# Patient Record
Sex: Male | Born: 1970 | Race: White | Hispanic: No | Marital: Single | Smoking: Never smoker
Health system: Southern US, Community
[De-identification: ages and names within clinical notes are randomized; demographics above are authoritative.]

## PROBLEM LIST (undated history)

## (undated) DIAGNOSIS — M1990 Unspecified osteoarthritis, unspecified site: Secondary | ICD-10-CM

## (undated) DIAGNOSIS — I10 Essential (primary) hypertension: Secondary | ICD-10-CM

## (undated) DIAGNOSIS — I671 Cerebral aneurysm, nonruptured: Secondary | ICD-10-CM

## (undated) HISTORY — PX: HERNIA REPAIR: SHX51

## (undated) HISTORY — PX: WISDOM TOOTH EXTRACTION: SHX21

---

## 2018-09-11 ENCOUNTER — Emergency Department (HOSPITAL_BASED_OUTPATIENT_CLINIC_OR_DEPARTMENT_OTHER)
Admission: EM | Admit: 2018-09-11 | Discharge: 2018-09-11 | Disposition: A | Discharge status: Home / Self Care | Payer: PRIVATE HEALTH INSURANCE | Attending: Emergency Medicine | Admitting: Emergency Medicine

## 2018-09-11 ENCOUNTER — Other Ambulatory Visit: Payer: Self-pay

## 2018-09-11 ENCOUNTER — Encounter (HOSPITAL_BASED_OUTPATIENT_CLINIC_OR_DEPARTMENT_OTHER): Payer: Self-pay | Admitting: Emergency Medicine

## 2018-09-11 DIAGNOSIS — M545 Low back pain, unspecified: Secondary | ICD-10-CM

## 2018-09-11 DIAGNOSIS — I10 Essential (primary) hypertension: Secondary | ICD-10-CM | POA: Insufficient documentation

## 2018-09-11 DIAGNOSIS — Z79899 Other long term (current) drug therapy: Secondary | ICD-10-CM | POA: Diagnosis not present

## 2018-09-11 HISTORY — DX: Essential (primary) hypertension: I10

## 2018-09-11 MED ORDER — KETOROLAC TROMETHAMINE 15 MG/ML IJ SOLN
15.0000 mg | Freq: Once | INTRAMUSCULAR | Status: AC
  Administered 2018-09-11: 15 mg via INTRAMUSCULAR
  Filled 2018-09-11: qty 1

## 2018-09-11 MED ORDER — METHOCARBAMOL 500 MG PO TABS: 500.0000 mg | ORAL_TABLET | Freq: Every evening | ORAL | 0 refills | Status: DC | PRN

## 2018-09-11 MED ORDER — PREDNISONE 10 MG (21) PO TBPK: ORAL_TABLET | Freq: Every day | ORAL | 0 refills | Status: DC

## 2018-09-11 NOTE — ED Notes (Signed)
Pt/family verbalized understanding of discharge instructions.   

## 2018-09-11 NOTE — Discharge Instructions (Addendum)
Take prednisone as prescribed. Avoid other anti-inflammatories at the same time (Advil, Motrin, ibuprofen, naproxen, Aleve). You may supplement with Tylenol if you need further pain control. Use Robaxin as needed for muscle stiffness or soreness. Have caution, as this may make you tired or groggy. Do not drive or operate heavy machinery while taking this medication.  Use muscle creams (bengay, icy hot, salonpas) as needed for pain.  Use heat or ice for pain control. You may try back stretches for pain. Follow up with orthopedics for further evaluation.  Return to the ER if you develop high fevers, numbness, loss of bowel or bladder control, or any new or concerning symptoms.

## 2018-09-11 NOTE — ED Provider Notes (Signed)
[facility] EMERGENCY DEPARTMENT Provider Note   CSN: [id number] Arrival date & time: 09/11/18  1230     History   Chief Complaint Chief Complaint  Patient presents with  . Back Pain    HPI Derrick Wall is a 47 y.o. male center for evaluation of back pain.  Patient states over the past 6 to 7 months, he has been having persistent low back pain.  Is bilateral, currently worse in the left side.  Symptoms are worse when he is walking, improves when he is sitting.  He has been evaluated by his PCP multiple times for this, been found to have degenerative disease, bone spurs, and a pinched nerve.  He was referred to spine clinic, but states he cannot afford to follow-up with spine.  He denies new fall, trauma, or injury.  He denies fevers, chills, rash, numbness, loss of bowel bladder control, history of cancer, history of IV drug use.  He has been taking Aleve without significant improvement of his symptoms.  He has not been taking anything else for pain.  He saw a chiropractor, had a realignment which mildly improved his pain.  This is about a month and a half ago.  He is here hoping to get an MRI for further evaluation.  Patient describes pain as a "catching" pain.  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  HPI  Past Medical History:  Diagnosis Date  . Hypertension     There are no active problems to display for this patient.   Past Surgical History:  Procedure Laterality Date  . HERNIA REPAIR          Home Medications    Prior to Admission medications   Medication Sig Start Date End Date Taking? Authorizing Provider  amLODipine (NORVASC) 10 MG tablet Take 10 mg by mouth daily.   Yes [provider]  losartan (COZAAR) 25 MG tablet Take 25 mg by mouth daily.   Yes [provider]  tamsulosin (FLOMAX) 0.4 MG CAPS capsule Take 0.4 mg by mouth.   Yes [provider]  methocarbamol (ROBAXIN) 500 MG tablet Take 1 tablet  (500 mg total) by mouth at bedtime as needed for muscle spasms. 09/11/18   Dejae Bernet, PA-C  predniSONE (STERAPRED UNI-PAK 21 TAB) 10 MG (21) TBPK tablet Take by mouth daily. Take 6 tabs by mouth daily  for 2 days, then 5 tabs for 2 days, then 4 tabs for 2 days, then 3 tabs for 2 days, 2 tabs for 2 days, then 1 tab by mouth daily for 2 days 09/11/18   Zian Delair, PA-C    Family History No family history on file.  Social History Social History   Tobacco Use  . Smoking status: Never Smoker  . Smokeless tobacco: Never Used  Substance Use Topics  . Alcohol use: Yes    Comment: daily  . Drug use: Yes    Types: Marijuana     Allergies   Patient has no known allergies.   Review of Systems Review of Systems  Constitutional: Negative for fever.  Genitourinary: Negative for frequency and hematuria.  Musculoskeletal: Positive for back pain. Negative for neck pain.     Physical Exam Updated Vital Signs BP (!) 146/113 (BP Location: Left Arm)   Pulse 87   Temp 98.7 F (37.1 C) (Oral)   Resp 18   Ht 5\' 5"  (1.651 m)   Wt 102.5 kg   SpO2 99%   BMI  37.61 kg/m   Physical Exam  Constitutional: He is oriented to person, place, and time. He appears well-developed and well-nourished. No distress.  Sitting in the bed in no acute distress  HENT:  Head: Normocephalic and atraumatic.  Eyes: Pupils are equal, round, and reactive to light. Conjunctivae and EOM are normal.  Neck: Normal range of motion. Neck supple.  Cardiovascular: Normal rate, regular rhythm and intact distal pulses.  Pulmonary/Chest: Effort normal and breath sounds normal. No respiratory distress. He has no wheezes.  Abdominal: Soft. He exhibits no distension. There is no tenderness.  Musculoskeletal: Normal range of motion. He exhibits tenderness.  Tenderness palpation low back and bilateral low back musculature.  No step-offs or deformities.  Strength of lower extremities intact bilaterally.  Station  intact bilaterally.  Pedal pulses intact bilaterally.  Soft compartments.  Patient is ambulatory. No saddle paresthesias.  Neurological: He is alert and oriented to person, place, and time. No sensory deficit.  Skin: Skin is warm and dry. Capillary refill takes less than 2 seconds.  Psychiatric: He has a normal mood and affect.  Nursing note and vitals reviewed.    ED Treatments / Results  Labs (all labs ordered are listed, but only abnormal results are displayed) Labs Reviewed - No data to display  EKG None  Radiology No results found.  Procedures Procedures (including critical care time)  Medications Ordered in ED Medications  ketorolac (TORADOL) 15 MG/ML injection 15 mg (15 mg Intramuscular Given 09/11/18 1318)     Initial Impression / Assessment and Plan / ED Course  I have reviewed the triage vital signs and the nursing notes.  Pertinent labs & imaging results that were available during my care of the patient were reviewed by me and considered in my medical decision making (see chart for details).     Patient presenting for evaluation of low back pain.  Physical exam reassuring, neurovascularly intact.  No red flags for back pain.  Pain is reproducible with palpation. Pt had imaging with his PCP, told that he has a pinched nerve and degenerative disease.  No new injury, I do not believe further imaging is necessary today. Doubt new vertebral injury, infection, spinal cord compression, myelopathy, or cauda equina syndrome.  Will treat symptomatically with prednisone course, muscle relaxers, muscle creams.  Patient given information to follow-up with orthopedics.  At this time, patient appears safe for discharge.  Return precautions given.  Patient states he understands agrees plan.  Final Clinical Impressions(s) / ED Diagnoses   Final diagnoses:  Acute bilateral low back pain without sciatica    ED Discharge Orders         Ordered    predniSONE (STERAPRED UNI-PAK 21  TAB) 10 MG (21) TBPK tablet  Daily     09/11/18 1315    methocarbamol (ROBAXIN) 500 MG tablet  At bedtime PRN     09/11/18 1315           Johnette Teigen, PA-C 09/11/18 1425    Pricilla LovelessGoldston, Scott, MD 09/11/18 1426

## 2018-09-11 NOTE — ED Triage Notes (Addendum)
Low back pain x 6 months, worsening over the last week. States he has had to use a cane to ambulate.

## 2018-09-27 ENCOUNTER — Other Ambulatory Visit: Payer: Self-pay | Admitting: Neurosurgery

## 2018-09-27 ENCOUNTER — Other Ambulatory Visit (HOSPITAL_COMMUNITY): Payer: Self-pay | Admitting: Neurosurgery

## 2018-09-27 DIAGNOSIS — M4714 Other spondylosis with myelopathy, thoracic region: Secondary | ICD-10-CM

## 2018-09-30 NOTE — H&P (Addendum)
Chief Complaint   Back pain  HPI   HPI: Derrick Wall is a 47 y.o. male with 7 month history of low back pain and worsening gait instability.   Pain is localized to the midline of his thoracolumbar junction.  Pain is worse with flexion.  Pain does not radiate.  Pain has been progressively worsening and in fact over the past 2 weeks he has had to use a wheelchair due to leg weakness and numbness.  He has also noted urinary retention. 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  He presents today for  diagnostic angiogram. He is without any concerns.   There are no active problems to display for this patient.   PMH: Past Medical History:  Diagnosis Date  . Hypertension     PSH: Past Surgical History:  Procedure Laterality Date  . HERNIA REPAIR       (Not in a hospital admission)  SH: Social History   Tobacco Use  . Smoking status: Never Smoker  . Smokeless tobacco: Never Used  Substance Use Topics  . Alcohol use: Yes    Comment: daily  . Drug use: Yes    Types: Marijuana    MEDS: Prior to Admission medications   Medication Sig Start Date End Date Taking? Authorizing Provider  amLODipine (NORVASC) 10 MG tablet Take 10 mg by mouth daily.    [provider]  losartan (COZAAR) 25 MG tablet Take 25 mg by mouth daily.    [provider]  methocarbamol (ROBAXIN) 500 MG tablet Take 1 tablet (500 mg total) by mouth at bedtime as needed for muscle spasms. 09/11/18   Caccavale, Sophia, PA-C  predniSONE (STERAPRED UNI-PAK 21 TAB) 10 MG (21) TBPK tablet Take by mouth daily. Take 6 tabs by mouth daily  for 2 days, then 5 tabs for 2 days, then 4 tabs for 2 days, then 3 tabs for 2 days, 2 tabs for 2 days, then 1 tab by mouth daily for 2 days 09/11/18   Caccavale, Sophia, PA-C  tamsulosin (FLOMAX) 0.4 MG CAPS capsule Take 0.4 mg by mouth.    [provider]    ALLERGY: No Known  Allergies  Social History   Tobacco Use  . Smoking status: Never Smoker  . Smokeless tobacco: Never Used  Substance Use Topics  . Alcohol use: Yes    Comment: daily     No family history on file.   ROS   ROS  Exam   There were no vitals filed for this visit. General appearance: WDWN, NAD Eyes: No scleral injection Cardiovascular: Regular rate and rhythm without murmurs, rubs, gallops. No edema or variciosities. Distal pulses normal. Pulmonary: Effort normal, non-labored breathing Musculoskeletal:     Muscle tone upper extremities: Normal    Muscle tone lower extremities: Normal    Motor exam:  Right Left Deltoid: normal normal Biceps: normal normal Triceps: normal normal Infraspinatus: normal normal Wrist Extensor: normal normal Grip: normal normal Hip Flexor: 4-/5 4-/5 Knee Extensor: 4-/5 4-/5 Tib Anterior: 4-/5 4-/5 EHL: 4-/5 4-/5 Medial Gastroc: 4-/5 4-/5   Neurological Mental Status:    - Patient is awake, alert, oriented to person, place, month, year, and situation    - Patient is able to give a clear and coherent history.    - No signs of aphasia or neglect Cranial Nerves    - II: Visual Fields are full. Pupils are equal, round, and reactive  to light.     - III/IV/VI: EOMI without ptosis or diploplia.     - V: Facial sensation is grossly normal    - VII: Facial movement is symmetric.     - VIII: hearing is intact to voice    - X: Uvula elevates symmetrically    - XI: Shoulder shrug is symmetric.    - XII: tongue is midline without atrophy or fasciculations.  Sensory:   T12 sensory level diffuse numbness distally. Deep Tendon Reflexes    -   Normal in biceps.  Decreased and patellar bilaterally. Plantars   - Toes are downgoing bilaterally.   Results - Imaging/Labs   No results found for this or any previous visit (from the past 48 hour(s)).  No results found.  IMAGING: MRI of the thoracic and lumbar spine personally reviewed, there is  diffuse T2 hyperintensity throughout the thoracic cord from T9 down to the conus without any clear evidence of enhancing lesion.  On the sagittal T2 of the lumbar spine, at the area of T10-T12, there is some dorsal T2 hypointensity that could be due to flow voids.  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  We will proceed with spinal angiogram to confirm that diagnosis  In further characterize the fistula.    Risk, benefits and alternatives procedure were discussed.  Patient states in own language understanding wishes to proceed.

## 2018-10-04 ENCOUNTER — Other Ambulatory Visit (HOSPITAL_COMMUNITY): Payer: Self-pay | Admitting: Neurosurgery

## 2018-10-04 ENCOUNTER — Other Ambulatory Visit: Payer: Self-pay

## 2018-10-04 ENCOUNTER — Ambulatory Visit (HOSPITAL_COMMUNITY)
Admission: RE | Admit: 2018-10-04 | Discharge: 2018-10-04 | Disposition: A | Discharge status: Home / Self Care | Payer: PRIVATE HEALTH INSURANCE | Source: Ambulatory Visit | Attending: Neurosurgery | Admitting: Neurosurgery

## 2018-10-04 ENCOUNTER — Encounter (HOSPITAL_COMMUNITY): Payer: Self-pay | Admitting: Neurosurgery

## 2018-10-04 DIAGNOSIS — G9519 Other vascular myelopathies: Secondary | ICD-10-CM | POA: Insufficient documentation

## 2018-10-04 DIAGNOSIS — R339 Retention of urine, unspecified: Secondary | ICD-10-CM | POA: Diagnosis not present

## 2018-10-04 DIAGNOSIS — M545 Low back pain: Secondary | ICD-10-CM | POA: Insufficient documentation

## 2018-10-04 DIAGNOSIS — I10 Essential (primary) hypertension: Secondary | ICD-10-CM | POA: Insufficient documentation

## 2018-10-04 DIAGNOSIS — M5104 Intervertebral disc disorders with myelopathy, thoracic region: Secondary | ICD-10-CM | POA: Diagnosis not present

## 2018-10-04 DIAGNOSIS — R200 Anesthesia of skin: Secondary | ICD-10-CM | POA: Insufficient documentation

## 2018-10-04 DIAGNOSIS — R531 Weakness: Secondary | ICD-10-CM | POA: Diagnosis not present

## 2018-10-04 DIAGNOSIS — M4714 Other spondylosis with myelopathy, thoracic region: Secondary | ICD-10-CM

## 2018-10-04 HISTORY — PX: IR ANGIO/SPINAL RIGHT: IMG2271

## 2018-10-04 HISTORY — PX: IR ANGIO/SPINAL LEFT: IMG2270

## 2018-10-04 HISTORY — PX: IR ANGIO VERTEBRAL SEL VERTEBRAL UNI L MOD SED: IMG5367

## 2018-10-04 HISTORY — PX: IR ANGIO INTRA EXTRACRAN SEL INTERNAL CAROTID BILAT MOD SED: IMG5363

## 2018-10-04 LAB — CBC WITH DIFFERENTIAL/PLATELET
Abs Immature Granulocytes: 0.02 10*3/uL (ref 0.00–0.07)
Basophils Absolute: 0 10*3/uL (ref 0.0–0.1)
Basophils Relative: 0 %
EOS PCT: 2 %
Eosinophils Absolute: 0.1 10*3/uL (ref 0.0–0.5)
HCT: 46.7 % (ref 39.0–52.0)
HEMOGLOBIN: 15.7 g/dL (ref 13.0–17.0)
Immature Granulocytes: 0 %
LYMPHS PCT: 28 %
Lymphs Abs: 1.8 10*3/uL (ref 0.7–4.0)
MCH: 31.7 pg (ref 26.0–34.0)
MCHC: 33.6 g/dL (ref 30.0–36.0)
MCV: 94.3 fL (ref 80.0–100.0)
MONO ABS: 0.6 10*3/uL (ref 0.1–1.0)
Monocytes Relative: 9 %
Neutro Abs: 4 10*3/uL (ref 1.7–7.7)
Neutrophils Relative %: 61 %
Platelets: 199 10*3/uL (ref 150–400)
RBC: 4.95 MIL/uL (ref 4.22–5.81)
RDW: 12.3 % (ref 11.5–15.5)
WBC: 6.5 10*3/uL (ref 4.0–10.5)
nRBC: 0 % (ref 0.0–0.2)

## 2018-10-04 LAB — PROTIME-INR
INR: 0.93
Prothrombin Time: 12.4 seconds (ref 11.4–15.2)

## 2018-10-04 LAB — APTT: aPTT: 29 seconds (ref 24–36)

## 2018-10-04 MED ORDER — CHLORHEXIDINE GLUCONATE CLOTH 2 % EX PADS: 6.0000 | MEDICATED_PAD | Freq: Once | CUTANEOUS | Status: DC

## 2018-10-04 MED ORDER — MIDAZOLAM HCL 2 MG/2ML IJ SOLN
INTRAMUSCULAR | Status: AC
  Filled 2018-10-04: qty 2

## 2018-10-04 MED ORDER — FENTANYL CITRATE (PF) 100 MCG/2ML IJ SOLN
INTRAMUSCULAR | Status: AC
  Filled 2018-10-04: qty 2

## 2018-10-04 MED ORDER — MIDAZOLAM HCL 2 MG/2ML IJ SOLN
INTRAMUSCULAR | Status: AC | PRN
  Administered 2018-10-04: 1 mg via INTRAVENOUS

## 2018-10-04 MED ORDER — HEPARIN SODIUM (PORCINE) 1000 UNIT/ML IJ SOLN
INTRAMUSCULAR | Status: AC
  Filled 2018-10-04: qty 1

## 2018-10-04 MED ORDER — IOPAMIDOL (ISOVUE-300) INJECTION 61%
INTRAVENOUS | Status: AC
  Filled 2018-10-04: qty 50

## 2018-10-04 MED ORDER — HEPARIN SODIUM (PORCINE) 1000 UNIT/ML IJ SOLN
INTRAMUSCULAR | Status: AC | PRN
  Administered 2018-10-04: 2000 [IU] via INTRAVENOUS

## 2018-10-04 MED ORDER — CEFAZOLIN SODIUM-DEXTROSE 2-4 GM/100ML-% IV SOLN: 2.0000 g | INTRAVENOUS | Status: DC

## 2018-10-04 MED ORDER — IOHEXOL 300 MG/ML  SOLN: 99.0000 mL | Freq: Once | INTRAMUSCULAR | Status: DC | PRN

## 2018-10-04 MED ORDER — HYDROCODONE-ACETAMINOPHEN 5-325 MG PO TABS: 1.0000 | ORAL_TABLET | ORAL | Status: DC | PRN

## 2018-10-04 MED ORDER — LIDOCAINE HCL 1 % IJ SOLN
INTRAMUSCULAR | Status: AC
  Filled 2018-10-04: qty 20

## 2018-10-04 MED ORDER — FENTANYL CITRATE (PF) 100 MCG/2ML IJ SOLN
INTRAMUSCULAR | Status: AC | PRN
  Administered 2018-10-04: 25 ug via INTRAVENOUS

## 2018-10-04 MED ORDER — LIDOCAINE HCL (PF) 1 % IJ SOLN
INTRAMUSCULAR | Status: AC | PRN
  Administered 2018-10-04: 10 mL

## 2018-10-04 MED ORDER — SODIUM CHLORIDE 0.9 % IV SOLN: INTRAVENOUS | Status: DC

## 2018-10-04 NOTE — Progress Notes (Signed)
Patient and RN ambulated to bathroom. No bleeding or hematoma noted

## 2018-10-04 NOTE — Sedation Documentation (Signed)
Right groin sheath removed. 5Fr exoseal closure device used. Manual pressure being held at right groin site.

## 2018-10-04 NOTE — Discharge Instructions (Signed)

## 2018-10-06 ENCOUNTER — Encounter (HOSPITAL_COMMUNITY): Payer: Self-pay | Admitting: Neurosurgery

## 2018-10-12 ENCOUNTER — Other Ambulatory Visit: Payer: Self-pay | Admitting: Neurological Surgery

## 2018-10-15 ENCOUNTER — Other Ambulatory Visit: Payer: Self-pay

## 2018-10-15 ENCOUNTER — Encounter (HOSPITAL_COMMUNITY): Payer: Self-pay

## 2018-10-15 ENCOUNTER — Encounter (HOSPITAL_COMMUNITY)
Admission: RE | Admit: 2018-10-15 | Discharge: 2018-10-15 | Disposition: A | Discharge status: Home / Self Care | Payer: PRIVATE HEALTH INSURANCE | Source: Ambulatory Visit | Attending: Neurological Surgery | Admitting: Neurological Surgery

## 2018-10-15 DIAGNOSIS — I444 Left anterior fascicular block: Secondary | ICD-10-CM | POA: Insufficient documentation

## 2018-10-15 DIAGNOSIS — R9431 Abnormal electrocardiogram [ECG] [EKG]: Secondary | ICD-10-CM | POA: Diagnosis not present

## 2018-10-15 DIAGNOSIS — I10 Essential (primary) hypertension: Secondary | ICD-10-CM | POA: Diagnosis not present

## 2018-10-15 DIAGNOSIS — Z01818 Encounter for other preprocedural examination: Secondary | ICD-10-CM | POA: Insufficient documentation

## 2018-10-15 HISTORY — DX: Unspecified osteoarthritis, unspecified site: M19.90

## 2018-10-15 LAB — COMPREHENSIVE METABOLIC PANEL
ALBUMIN: 4 g/dL (ref 3.5–5.0)
ALT: 30 U/L (ref 0–44)
ANION GAP: 11 (ref 5–15)
AST: 17 U/L (ref 15–41)
Alkaline Phosphatase: 70 U/L (ref 38–126)
BUN: 8 mg/dL (ref 6–20)
CHLORIDE: 103 mmol/L (ref 98–111)
CO2: 25 mmol/L (ref 22–32)
Calcium: 9.3 mg/dL (ref 8.9–10.3)
Creatinine, Ser: 0.59 mg/dL — ABNORMAL LOW (ref 0.61–1.24)
GFR calc Af Amer: >60 mL/min (ref >60)
GFR calc non Af Amer: >60 mL/min (ref >60)
Glucose, Bld: 112 mg/dL — ABNORMAL HIGH (ref 70–99)
Potassium: 3.7 mmol/L (ref 3.5–5.1)
Sodium: 139 mmol/L (ref 135–145)
TOTAL PROTEIN: 7 g/dL (ref 6.5–8.1)
Total Bilirubin: 0.9 mg/dL (ref 0.3–1.2)

## 2018-10-15 LAB — CBC
HCT: 46.1 % (ref 39.0–52.0)
Hemoglobin: 15.5 g/dL (ref 13.0–17.0)
MCH: 31.1 pg (ref 26.0–34.0)
MCHC: 33.6 g/dL (ref 30.0–36.0)
MCV: 92.6 fL (ref 80.0–100.0)
PLATELETS: 291 10*3/uL (ref 150–400)
RBC: 4.98 MIL/uL (ref 4.22–5.81)
RDW: 12.3 % (ref 11.5–15.5)
WBC: 7.7 10*3/uL (ref 4.0–10.5)
nRBC: 0 % (ref 0.0–0.2)

## 2018-10-15 LAB — TYPE AND SCREEN
ABO/RH(D): O POS
ANTIBODY SCREEN: NEGATIVE

## 2018-10-15 LAB — ABO/RH: ABO/RH(D): O POS

## 2018-10-15 LAB — SURGICAL PCR SCREEN
MRSA, PCR: NEGATIVE
STAPHYLOCOCCUS AUREUS: POSITIVE — AB

## 2018-10-15 NOTE — Progress Notes (Signed)
   10/15/18 1106  OBSTRUCTIVE SLEEP APNEA  Have you ever been diagnosed with sleep apnea through a sleep study? No  Do you snore loudly (loud enough to be heard through closed doors)?  1  Do you often feel tired, fatigued, or sleepy during the daytime (such as falling asleep during driving or talking to someone)? 0  Has anyone observed you stop breathing during your sleep? 0  Do you have, or are you being treated for high blood pressure? 1  BMI more than 35 kg/m2? 1  Age > 50 (1-yes) 0  Neck circumference greater than:Male 16 inches or larger, Male 17inches or larger? 1  Male Gender (Yes=1) 1  Obstructive Sleep Apnea Score 5  Score 5 or greater  Results sent to PCP

## 2018-10-15 NOTE — Progress Notes (Signed)
PCP - Midge Aver PA  EKG - 10/15/18  Blood Thinner Instructions: N/A Aspirin Instructions:N/A   Anesthesia review: yes. EKG review  Patient denies shortness of breath, fever, cough and chest pain at PAT appointment   Patient verbalized understanding of instructions that were given to them at the PAT appointment. Patient was also instructed that they will need to review over the PAT instructions again at home before surgery.

## 2018-10-15 NOTE — Pre-Procedure Instructions (Signed)
Derrick Wall  10/15/2018      [id number] [place], [place] - [address] [place] [address] Phone: [phone] Fax: [phone]    Your procedure is scheduled on Tuesday October 29th.  Report to [facility] [employer] Admitting at 0800 A.M.  Call this number if you have problems the morning of surgery:  [phone]   Remember:  Do not eat or drink after midnight.    Take these medicines the morning of surgery with A SIP OF WATER   amLODipine (NORVASC)  tamsulosin (FLOMAX)  7 days prior to surgery STOP taking any Aspirin(unless otherwise instructed by your surgeon), Aleve, Naproxen, Ibuprofen, Motrin, Advil, Goody's, BC's, all herbal medications, fish oil, and all vitamins     Do not wear jewelry.  Do not wear lotions, powders, or colognes, or deodorant.  Men may shave face and neck.  Do not bring valuables to the hospital.  [facility] is not responsible for any belongings or valuables.  Contacts, dentures or bridgework may not be worn into surgery.  Leave your suitcase in the car.  After surgery it may be brought to your room.  For patients admitted to the hospital, discharge time will be determined by your treatment team.  Patients discharged the day of surgery will not be allowed to drive home.    [facility]- Preparing For Surgery  Before surgery, you can play an important role. Because skin is not sterile, your skin needs to be as free of germs as possible. You can reduce the number of germs on your skin by washing with CHG (chlorahexidine gluconate) Soap before surgery.  CHG is an antiseptic cleaner which kills germs and bonds with the skin to continue killing germs even after washing.    Oral Hygiene is also important to reduce your risk of infection.  Remember - BRUSH YOUR TEETH THE MORNING OF SURGERY WITH YOUR REGULAR TOOTHPASTE  Please do not use if you have an allergy to CHG or antibacterial soaps. If  your skin becomes reddened/irritated stop using the CHG.  Do not shave (including legs and underarms) for at least 48 hours prior to first CHG shower. It is OK to shave your face.  Please follow these instructions carefully.   1. Shower the NIGHT BEFORE SURGERY and the MORNING OF SURGERY with CHG.   2. If you chose to wash your hair, wash your hair first as usual with your normal shampoo.  3. After you shampoo, rinse your hair and body thoroughly to remove the shampoo.  4. Use CHG as you would any other liquid soap. You can apply CHG directly to the skin and wash gently with a scrungie or a clean washcloth.   5. Apply the CHG Soap to your body ONLY FROM THE NECK DOWN.  Do not use on open wounds or open sores. Avoid contact with your eyes, ears, mouth and genitals (private parts). Wash Face and genitals (private parts)  with your normal soap.  6. Wash thoroughly, paying special attention to the area where your surgery will be performed.  7. Thoroughly rinse your body with warm water from the neck down.  8. DO NOT shower/wash with your normal soap after using and rinsing off the CHG Soap.  9. Pat yourself dry with a CLEAN TOWEL.  10. Wear CLEAN PAJAMAS to bed the night before surgery, wear comfortable clothes the morning of surgery  11. Place CLEAN SHEETS on your bed  the night of your first shower and DO NOT SLEEP WITH PETS.    Day of Surgery:  Do not apply any deodorants/lotions.  Please wear clean clothes to the hospital/surgery center.   Remember to brush your teeth WITH YOUR REGULAR TOOTHPASTE.    Please read over the following fact sheets that you were given.

## 2018-10-19 ENCOUNTER — Other Ambulatory Visit: Payer: Self-pay

## 2018-10-19 ENCOUNTER — Inpatient Hospital Stay (HOSPITAL_COMMUNITY): Payer: Self-pay | Admitting: Certified Registered"

## 2018-10-19 ENCOUNTER — Encounter (HOSPITAL_COMMUNITY): Payer: Self-pay

## 2018-10-19 ENCOUNTER — Inpatient Hospital Stay (HOSPITAL_COMMUNITY)
Admission: RE | Admit: 2018-10-19 | Discharge: 2018-10-20 | DRG: 253 | Disposition: A | Discharge status: Home / Self Care | Payer: PRIVATE HEALTH INSURANCE | Source: Ambulatory Visit | Attending: Neurological Surgery | Admitting: Neurological Surgery

## 2018-10-19 ENCOUNTER — Inpatient Hospital Stay (HOSPITAL_COMMUNITY): Payer: Self-pay

## 2018-10-19 ENCOUNTER — Inpatient Hospital Stay (HOSPITAL_COMMUNITY): Payer: Self-pay | Admitting: Physician Assistant

## 2018-10-19 ENCOUNTER — Encounter (HOSPITAL_COMMUNITY)
Admission: RE | Disposition: A | Discharge status: Home / Self Care | Payer: Self-pay | Source: Ambulatory Visit | Attending: Neurological Surgery

## 2018-10-19 DIAGNOSIS — I10 Essential (primary) hypertension: Secondary | ICD-10-CM | POA: Diagnosis present

## 2018-10-19 DIAGNOSIS — G959 Disease of spinal cord, unspecified: Secondary | ICD-10-CM | POA: Diagnosis present

## 2018-10-19 DIAGNOSIS — Q2739 Arteriovenous malformation, other site: Principal | ICD-10-CM

## 2018-10-19 DIAGNOSIS — Z6838 Body mass index (BMI) 38.0-38.9, adult: Secondary | ICD-10-CM

## 2018-10-19 DIAGNOSIS — Z419 Encounter for procedure for purposes other than remedying health state, unspecified: Secondary | ICD-10-CM

## 2018-10-19 DIAGNOSIS — Q288 Other specified congenital malformations of circulatory system: Secondary | ICD-10-CM

## 2018-10-19 DIAGNOSIS — Z23 Encounter for immunization: Secondary | ICD-10-CM

## 2018-10-19 HISTORY — DX: Cerebral aneurysm, nonruptured: I67.1

## 2018-10-19 HISTORY — PX: LAMINECTOMY: SHX219

## 2018-10-19 SURGERY — THORACIC LAMINECTOMY FOR TUMOR
Anesthesia: General | Site: Spine Thoracic

## 2018-10-19 MED ORDER — INDOCYANINE GREEN 25 MG IV SOLR
INTRAVENOUS | Status: DC | PRN
  Administered 2018-10-19: 25 mg via INTRAVENOUS

## 2018-10-19 MED ORDER — POLYETHYLENE GLYCOL 3350 17 G PO PACK: 17.0000 g | PACK | Freq: Every day | ORAL | Status: DC | PRN

## 2018-10-19 MED ORDER — HYDROMORPHONE HCL 1 MG/ML IJ SOLN
0.2500 mg | INTRAMUSCULAR | Status: DC | PRN
  Administered 2018-10-19: 0.5 mg via INTRAVENOUS

## 2018-10-19 MED ORDER — ONDANSETRON HCL 4 MG/2ML IJ SOLN
INTRAMUSCULAR | Status: DC | PRN
  Administered 2018-10-19: 4 mg via INTRAVENOUS

## 2018-10-19 MED ORDER — TAMSULOSIN HCL 0.4 MG PO CAPS
0.4000 mg | ORAL_CAPSULE | Freq: Every day | ORAL | Status: DC
  Administered 2018-10-19 – 2018-10-20 (×2): 0.4 mg via ORAL
  Filled 2018-10-19 (×2): qty 1

## 2018-10-19 MED ORDER — SODIUM CHLORIDE 0.9 % IV SOLN
INTRAVENOUS | Status: DC
  Administered 2018-10-19: 19:00:00 via INTRAVENOUS

## 2018-10-19 MED ORDER — DEXAMETHASONE SODIUM PHOSPHATE 10 MG/ML IJ SOLN
INTRAMUSCULAR | Status: AC
  Filled 2018-10-19: qty 1

## 2018-10-19 MED ORDER — GLYCOPYRROLATE PF 0.2 MG/ML IJ SOSY
PREFILLED_SYRINGE | INTRAMUSCULAR | Status: DC | PRN
  Administered 2018-10-19: .1 mg via INTRAVENOUS

## 2018-10-19 MED ORDER — EPHEDRINE SULFATE-NACL 50-0.9 MG/10ML-% IV SOSY
PREFILLED_SYRINGE | INTRAVENOUS | Status: DC | PRN
  Administered 2018-10-19 (×3): 10 mg via INTRAVENOUS
  Administered 2018-10-19: 5 mg via INTRAVENOUS

## 2018-10-19 MED ORDER — SODIUM CHLORIDE 0.9 % IV SOLN
INTRAVENOUS | Status: DC | PRN
  Administered 2018-10-19: 500 mL

## 2018-10-19 MED ORDER — AMLODIPINE BESYLATE 10 MG PO TABS
10.0000 mg | ORAL_TABLET | Freq: Every day | ORAL | Status: DC
  Administered 2018-10-20: 10 mg via ORAL
  Filled 2018-10-19: qty 1

## 2018-10-19 MED ORDER — SUCCINYLCHOLINE CHLORIDE 200 MG/10ML IV SOSY
PREFILLED_SYRINGE | INTRAVENOUS | Status: AC
  Filled 2018-10-19: qty 10

## 2018-10-19 MED ORDER — LIDOCAINE 2% (20 MG/ML) 5 ML SYRINGE
INTRAMUSCULAR | Status: AC
  Filled 2018-10-19: qty 5

## 2018-10-19 MED ORDER — BUPIVACAINE HCL (PF) 0.5 % IJ SOLN
INTRAMUSCULAR | Status: DC | PRN
  Administered 2018-10-19: 3.5 mL

## 2018-10-19 MED ORDER — HYDROMORPHONE HCL 1 MG/ML IJ SOLN
INTRAMUSCULAR | Status: AC
  Filled 2018-10-19: qty 1

## 2018-10-19 MED ORDER — DEXAMETHASONE SODIUM PHOSPHATE 4 MG/ML IJ SOLN
INTRAMUSCULAR | Status: DC | PRN
  Administered 2018-10-19: 10 mg via INTRAVENOUS

## 2018-10-19 MED ORDER — ONDANSETRON HCL 4 MG/2ML IJ SOLN
INTRAMUSCULAR | Status: AC
  Filled 2018-10-19: qty 2

## 2018-10-19 MED ORDER — PROPOFOL 10 MG/ML IV BOLUS
INTRAVENOUS | Status: AC
  Filled 2018-10-19: qty 20

## 2018-10-19 MED ORDER — PROPOFOL 10 MG/ML IV BOLUS
INTRAVENOUS | Status: DC | PRN
  Administered 2018-10-19: 150 mg via INTRAVENOUS

## 2018-10-19 MED ORDER — CEFAZOLIN SODIUM-DEXTROSE 2-4 GM/100ML-% IV SOLN
2.0000 g | Freq: Three times a day (TID) | INTRAVENOUS | Status: AC
  Administered 2018-10-19 – 2018-10-20 (×2): 2 g via INTRAVENOUS
  Filled 2018-10-19 (×2): qty 100

## 2018-10-19 MED ORDER — CEFAZOLIN SODIUM-DEXTROSE 2-4 GM/100ML-% IV SOLN
2.0000 g | INTRAVENOUS | Status: AC
  Administered 2018-10-19: 2 g via INTRAVENOUS

## 2018-10-19 MED ORDER — PHENYLEPHRINE 40 MCG/ML (10ML) SYRINGE FOR IV PUSH (FOR BLOOD PRESSURE SUPPORT)
PREFILLED_SYRINGE | INTRAVENOUS | Status: AC
  Filled 2018-10-19: qty 10

## 2018-10-19 MED ORDER — CHLORHEXIDINE GLUCONATE CLOTH 2 % EX PADS: 6.0000 | MEDICATED_PAD | Freq: Once | CUTANEOUS | Status: DC

## 2018-10-19 MED ORDER — SODIUM CHLORIDE 0.9 % IV SOLN
INTRAVENOUS | Status: DC | PRN
  Administered 2018-10-19: 50 ug/min via INTRAVENOUS
  Administered 2018-10-19: 14:00:00 via INTRAVENOUS

## 2018-10-19 MED ORDER — THROMBIN 5000 UNITS EX SOLR
CUTANEOUS | Status: AC
  Filled 2018-10-19: qty 5000

## 2018-10-19 MED ORDER — MIDAZOLAM HCL 5 MG/5ML IJ SOLN
INTRAMUSCULAR | Status: DC | PRN
  Administered 2018-10-19: 2 mg via INTRAVENOUS

## 2018-10-19 MED ORDER — FENTANYL CITRATE (PF) 250 MCG/5ML IJ SOLN
INTRAMUSCULAR | Status: AC
  Filled 2018-10-19: qty 5

## 2018-10-19 MED ORDER — INFLUENZA VAC SPLIT QUAD 0.5 ML IM SUSY
0.5000 mL | PREFILLED_SYRINGE | INTRAMUSCULAR | Status: AC
  Administered 2018-10-20: 0.5 mL via INTRAMUSCULAR
  Filled 2018-10-19: qty 0.5

## 2018-10-19 MED ORDER — ACETAMINOPHEN 650 MG RE SUPP: 650.0000 mg | RECTAL | Status: DC | PRN

## 2018-10-19 MED ORDER — SODIUM CHLORIDE 0.9 % IV SOLN: 250.0000 mL | INTRAVENOUS | Status: DC

## 2018-10-19 MED ORDER — THROMBIN 5000 UNITS EX SOLR
OROMUCOSAL | Status: DC | PRN
  Administered 2018-10-19: 12:00:00

## 2018-10-19 MED ORDER — 0.9 % SODIUM CHLORIDE (POUR BTL) OPTIME
TOPICAL | Status: DC | PRN
  Administered 2018-10-19: 1000 mL

## 2018-10-19 MED ORDER — ACETAMINOPHEN 325 MG PO TABS
650.0000 mg | ORAL_TABLET | ORAL | Status: DC | PRN
  Administered 2018-10-19: 650 mg via ORAL
  Filled 2018-10-19 (×2): qty 2

## 2018-10-19 MED ORDER — LACTATED RINGERS IV SOLN
INTRAVENOUS | Status: DC
  Administered 2018-10-19: 09:00:00 via INTRAVENOUS

## 2018-10-19 MED ORDER — BACITRACIN ZINC 500 UNIT/GM EX OINT
TOPICAL_OINTMENT | CUTANEOUS | Status: DC | PRN
  Administered 2018-10-19: 1 via TOPICAL

## 2018-10-19 MED ORDER — DOCUSATE SODIUM 100 MG PO CAPS
100.0000 mg | ORAL_CAPSULE | Freq: Two times a day (BID) | ORAL | Status: DC
  Administered 2018-10-19 – 2018-10-20 (×2): 100 mg via ORAL
  Filled 2018-10-19 (×2): qty 1

## 2018-10-19 MED ORDER — SODIUM CHLORIDE 0.9% FLUSH: 3.0000 mL | INTRAVENOUS | Status: DC | PRN

## 2018-10-19 MED ORDER — OXYCODONE HCL 5 MG PO TABS
10.0000 mg | ORAL_TABLET | ORAL | Status: DC | PRN
  Administered 2018-10-19 – 2018-10-20 (×2): 10 mg via ORAL
  Filled 2018-10-19 (×3): qty 2

## 2018-10-19 MED ORDER — OXYCODONE HCL 5 MG PO TABS: 5.0000 mg | ORAL_TABLET | ORAL | Status: DC | PRN

## 2018-10-19 MED ORDER — BUPIVACAINE HCL (PF) 0.5 % IJ SOLN
INTRAMUSCULAR | Status: AC
  Filled 2018-10-19: qty 30

## 2018-10-19 MED ORDER — CEFAZOLIN SODIUM-DEXTROSE 2-4 GM/100ML-% IV SOLN
INTRAVENOUS | Status: AC
  Filled 2018-10-19: qty 100

## 2018-10-19 MED ORDER — ONDANSETRON HCL 4 MG/2ML IJ SOLN: 4.0000 mg | Freq: Four times a day (QID) | INTRAMUSCULAR | Status: DC | PRN

## 2018-10-19 MED ORDER — FENTANYL CITRATE (PF) 100 MCG/2ML IJ SOLN
INTRAMUSCULAR | Status: DC | PRN
  Administered 2018-10-19: 100 ug via INTRAVENOUS
  Administered 2018-10-19 (×8): 50 ug via INTRAVENOUS

## 2018-10-19 MED ORDER — ONDANSETRON HCL 4 MG PO TABS: 4.0000 mg | ORAL_TABLET | Freq: Four times a day (QID) | ORAL | Status: DC | PRN

## 2018-10-19 MED ORDER — BACITRACIN ZINC 500 UNIT/GM EX OINT
TOPICAL_OINTMENT | CUTANEOUS | Status: AC
  Filled 2018-10-19: qty 28.35

## 2018-10-19 MED ORDER — LIDOCAINE HCL 4 % MT SOLN
OROMUCOSAL | Status: DC | PRN
  Administered 2018-10-19: 3 mL via TOPICAL

## 2018-10-19 MED ORDER — LACTATED RINGERS IV SOLN
INTRAVENOUS | Status: DC | PRN
  Administered 2018-10-19 (×2): via INTRAVENOUS

## 2018-10-19 MED ORDER — ALBUMIN HUMAN 5 % IV SOLN
INTRAVENOUS | Status: DC | PRN
  Administered 2018-10-19 (×2): via INTRAVENOUS

## 2018-10-19 MED ORDER — LIDOCAINE-EPINEPHRINE 1 %-1:100000 IJ SOLN
INTRAMUSCULAR | Status: DC | PRN
  Administered 2018-10-19: 3.5 mL

## 2018-10-19 MED ORDER — LIDOCAINE-EPINEPHRINE 1 %-1:100000 IJ SOLN
INTRAMUSCULAR | Status: AC
  Filled 2018-10-19: qty 1

## 2018-10-19 MED ORDER — PROPOFOL 500 MG/50ML IV EMUL
INTRAVENOUS | Status: DC | PRN
  Administered 2018-10-19: 100 ug/kg/min via INTRAVENOUS
  Administered 2018-10-19: 13:00:00 via INTRAVENOUS

## 2018-10-19 MED ORDER — HYDROMORPHONE HCL 1 MG/ML IJ SOLN: 0.5000 mg | INTRAMUSCULAR | Status: DC | PRN

## 2018-10-19 MED ORDER — EPHEDRINE 5 MG/ML INJ
INTRAVENOUS | Status: AC
  Filled 2018-10-19: qty 10

## 2018-10-19 MED ORDER — GLYCOPYRROLATE PF 0.2 MG/ML IJ SOSY
PREFILLED_SYRINGE | INTRAMUSCULAR | Status: AC
  Filled 2018-10-19: qty 1

## 2018-10-19 MED ORDER — MIDAZOLAM HCL 2 MG/2ML IJ SOLN
INTRAMUSCULAR | Status: AC
  Filled 2018-10-19: qty 2

## 2018-10-19 MED ORDER — SUCCINYLCHOLINE CHLORIDE 200 MG/10ML IV SOSY
PREFILLED_SYRINGE | INTRAVENOUS | Status: DC | PRN
  Administered 2018-10-19: 120 mg via INTRAVENOUS

## 2018-10-19 MED ORDER — INDOCYANINE GREEN 25 MG IV SOLR
INTRAVENOUS | Status: AC
  Filled 2018-10-19: qty 25

## 2018-10-19 MED ORDER — MENTHOL 3 MG MT LOZG: 1.0000 | LOZENGE | OROMUCOSAL | Status: DC | PRN

## 2018-10-19 MED ORDER — PHENYLEPHRINE 40 MCG/ML (10ML) SYRINGE FOR IV PUSH (FOR BLOOD PRESSURE SUPPORT)
PREFILLED_SYRINGE | INTRAVENOUS | Status: DC | PRN
  Administered 2018-10-19: 120 ug via INTRAVENOUS
  Administered 2018-10-19 (×2): 80 ug via INTRAVENOUS
  Administered 2018-10-19: 40 ug via INTRAVENOUS
  Administered 2018-10-19: 80 ug via INTRAVENOUS

## 2018-10-19 MED ORDER — THROMBIN (RECOMBINANT) 20000 UNITS EX SOLR
CUTANEOUS | Status: AC
  Filled 2018-10-19: qty 20000

## 2018-10-19 MED ORDER — SODIUM CHLORIDE 0.9% FLUSH
3.0000 mL | Freq: Two times a day (BID) | INTRAVENOUS | Status: DC
  Administered 2018-10-19 – 2018-10-20 (×2): 3 mL via INTRAVENOUS

## 2018-10-19 MED ORDER — LOSARTAN POTASSIUM 50 MG PO TABS
25.0000 mg | ORAL_TABLET | Freq: Every day | ORAL | Status: DC
  Administered 2018-10-20: 25 mg via ORAL
  Filled 2018-10-19: qty 1

## 2018-10-19 MED ORDER — PHENOL 1.4 % MT LIQD: 1.0000 | OROMUCOSAL | Status: DC | PRN

## 2018-10-19 MED ORDER — LIDOCAINE 2% (20 MG/ML) 5 ML SYRINGE
INTRAMUSCULAR | Status: DC | PRN
  Administered 2018-10-19: 60 mg via INTRAVENOUS

## 2018-10-19 SURGICAL SUPPLY — 66 items
BAG DECANTER FOR FLEXI CONT (MISCELLANEOUS) ×3 IMPLANT
BENZOIN TINCTURE PRP APPL 2/3 (GAUZE/BANDAGES/DRESSINGS) IMPLANT
BLADE CLIPPER SURG (BLADE) IMPLANT
BLADE SURG 11 STRL SS (BLADE) ×3 IMPLANT
BUR MATCHSTICK NEURO 3.0 LAGG (BURR) ×3 IMPLANT
BUR PRECISION FLUTE 5.0 (BURR) ×3 IMPLANT
CANISTER SUCT 3000ML PPV (MISCELLANEOUS) ×3 IMPLANT
CLIP ANEURY TI MINI STR 3MM (Clip) ×3 IMPLANT
CLOSURE WOUND 1/2 X4 (GAUZE/BANDAGES/DRESSINGS)
CONT SPEC 4OZ CLIKSEAL STRL BL (MISCELLANEOUS) ×3 IMPLANT
COVER WAND RF STERILE (DRAPES) IMPLANT
DECANTER SPIKE VIAL GLASS SM (MISCELLANEOUS) ×3 IMPLANT
DERMABOND ADVANCED (GAUZE/BANDAGES/DRESSINGS) ×2
DERMABOND ADVANCED .7 DNX12 (GAUZE/BANDAGES/DRESSINGS) ×1 IMPLANT
DRAPE C-ARM 42X72 X-RAY (DRAPES) ×6 IMPLANT
DRAPE LAPAROTOMY 100X72X124 (DRAPES) ×3 IMPLANT
DRAPE MICROSCOPE LEICA (MISCELLANEOUS) ×3 IMPLANT
DRAPE SURG 17X23 STRL (DRAPES) ×3 IMPLANT
DURAPREP 26ML APPLICATOR (WOUND CARE) ×3 IMPLANT
ELECT REM PT RETURN 9FT ADLT (ELECTROSURGICAL) ×3
ELECTRODE REM PT RTRN 9FT ADLT (ELECTROSURGICAL) ×1 IMPLANT
FEE INTRAOP MONITOR IMPULS NCS (MISCELLANEOUS) ×1 IMPLANT
FLOSEAL 5ML (HEMOSTASIS) IMPLANT
GAUZE 4X4 16PLY RFD (DISPOSABLE) IMPLANT
GAUZE SPONGE 4X4 12PLY STRL (GAUZE/BANDAGES/DRESSINGS) IMPLANT
GLOVE BIO SURGEON STRL SZ7.5 (GLOVE) ×6 IMPLANT
GLOVE BIOGEL PI IND STRL 7.5 (GLOVE) ×5 IMPLANT
GLOVE BIOGEL PI IND STRL 8 (GLOVE) ×1 IMPLANT
GLOVE BIOGEL PI INDICATOR 7.5 (GLOVE) ×10
GLOVE BIOGEL PI INDICATOR 8 (GLOVE) ×2
GLOVE ECLIPSE 7.0 STRL STRAW (GLOVE) ×3 IMPLANT
GLOVE SURG SS PI 7.0 STRL IVOR (GLOVE) ×9 IMPLANT
GLOVE SURG SS PI 7.5 STRL IVOR (GLOVE) ×18 IMPLANT
GOWN STRL REUS W/ TWL LRG LVL3 (GOWN DISPOSABLE) ×4 IMPLANT
GOWN STRL REUS W/ TWL XL LVL3 (GOWN DISPOSABLE) IMPLANT
GOWN STRL REUS W/TWL 2XL LVL3 (GOWN DISPOSABLE) IMPLANT
GOWN STRL REUS W/TWL LRG LVL3 (GOWN DISPOSABLE) ×8
GOWN STRL REUS W/TWL XL LVL3 (GOWN DISPOSABLE)
HEMOSTAT POWDER SURGIFOAM 1G (HEMOSTASIS) ×3 IMPLANT
INTRAOP MONITOR FEE IMPULS NCS (MISCELLANEOUS) ×1
INTRAOP MONITOR FEE IMPULSE (MISCELLANEOUS) ×2
KIT BASIN OR (CUSTOM PROCEDURE TRAY) ×3 IMPLANT
KIT POSITION SURG JACKSON T1 (MISCELLANEOUS) ×3 IMPLANT
KIT TURNOVER KIT B (KITS) ×3 IMPLANT
NEEDLE HYPO 18GX1.5 BLUNT FILL (NEEDLE) IMPLANT
NEEDLE HYPO 22GX1.5 SAFETY (NEEDLE) ×3 IMPLANT
NEEDLE SPNL 18GX3.5 QUINCKE PK (NEEDLE) ×3 IMPLANT
NS IRRIG 1000ML POUR BTL (IV SOLUTION) ×3 IMPLANT
PACK LAMINECTOMY NEURO (CUSTOM PROCEDURE TRAY) ×3 IMPLANT
PAD ARMBOARD 7.5X6 YLW CONV (MISCELLANEOUS) ×9 IMPLANT
RUBBERBAND STERILE (MISCELLANEOUS) ×6 IMPLANT
SEALANT ADHERUS EXTEND TIP (MISCELLANEOUS) ×3 IMPLANT
SPONGE LAP 4X18 RFD (DISPOSABLE) IMPLANT
SPONGE SURGIFOAM ABS GEL 100 (HEMOSTASIS) IMPLANT
STRIP CLOSURE SKIN 1/2X4 (GAUZE/BANDAGES/DRESSINGS) IMPLANT
SUT MNCRL AB 3-0 PS2 18 (SUTURE) ×3 IMPLANT
SUT NURALON 4 0 TR CR/8 (SUTURE) ×3 IMPLANT
SUT PROLENE 6 0 BV (SUTURE) IMPLANT
SUT VIC AB 0 CT1 18XCR BRD8 (SUTURE) ×2 IMPLANT
SUT VIC AB 0 CT1 8-18 (SUTURE) ×4
SUT VIC AB 2-0 CP2 18 (SUTURE) ×6 IMPLANT
SYR 3ML LL SCALE MARK (SYRINGE) IMPLANT
TOWEL GREEN STERILE (TOWEL DISPOSABLE) ×3 IMPLANT
TOWEL GREEN STERILE FF (TOWEL DISPOSABLE) ×3 IMPLANT
TRAY FOLEY MTR SLVR 16FR STAT (SET/KITS/TRAYS/PACK) ×3 IMPLANT
WATER STERILE IRR 1000ML POUR (IV SOLUTION) ×3 IMPLANT

## 2018-10-19 NOTE — Anesthesia Procedure Notes (Signed)
Procedure Name: Intubation Date/Time: 10/19/2018 11:29 AM Performed by: Orlie Dakin, CRNA Pre-anesthesia Checklist: Patient identified, Emergency Drugs available, Suction available and Patient being monitored Patient Re-evaluated:Patient Re-evaluated prior to induction Oxygen Delivery Method: Circle system utilized Preoxygenation: Pre-oxygenation with 100% oxygen Induction Type: IV induction Ventilation: Mask ventilation without difficulty Laryngoscope Size: Miller and 3 Grade View: Grade II Tube type: Oral Tube size: 7.5 mm Number of attempts: 1 Airway Equipment and Method: Stylet and LTA kit utilized Placement Confirmation: ETT inserted through vocal cords under direct vision,  breath sounds checked- equal and bilateral and positive ETCO2 Secured at: 23 cm Tube secured with: Tape Dental Injury: Teeth and Oropharynx as per pre-operative assessment  Comments: 4x4s bite block used.

## 2018-10-19 NOTE — Transfer of Care (Signed)
Immediate Anesthesia Transfer of Care Note  Patient: Derrick Wall  Procedure(s) Performed: Thoracic Nine to Thoracic Eleven Laminectomy for ligation of dural arteriovenous fistula (N/A Spine Thoracic)  Patient Location: PACU  Anesthesia Type:General  Level of Consciousness: awake, oriented and patient cooperative  Airway & Oxygen Therapy: Patient Spontanous Breathing and Patient connected to face mask oxygen  Post-op Assessment: Report given to RN, Post -op Vital signs reviewed and stable and Patient moving all extremities X 4  Post vital signs: Reviewed and stable  Last Vitals:  Vitals Value Taken Time  BP 139/103 10/19/2018  2:37 PM  Temp    Pulse 105 10/19/2018  2:39 PM  Resp 17 10/19/2018  2:39 PM  SpO2 100 % 10/19/2018  2:39 PM  Vitals shown include unvalidated device data.  Last Pain:  Vitals:   10/19/18 0859  TempSrc:   PainSc: 0-No pain      Patients Stated Pain Goal: 3 (10/19/18 0859)  Complications: No apparent anesthesia complications

## 2018-10-19 NOTE — Brief Op Note (Signed)
10/19/2018  2:34 PM  PATIENT:  Derrick Wall  47 y.o. male  PRE-OPERATIVE DIAGNOSIS:  Thoracic dural arteriovenous fistula  POST-OPERATIVE DIAGNOSIS:  Thoracic dural arteriovenous fistula  PROCEDURE:  Procedure(s) with comments: Thoracic Nine to Thoracic Eleven Laminectomy for ligation of dural arteriovenous fistula (N/A) - Thoracic Nine to Thoracic Eleven Laminectomy for ligation of dural arteriovenous fistula  SURGEON:  Surgeon(s) and Role:    * Ostergard, Clovis Pu, MD - Primary    * Lisbeth Renshaw, MD - Assisting  ANESTHESIA:   general  EBL:  200 mL   BLOOD ADMINISTERED:none  DRAINS: none   LOCAL MEDICATIONS USED:  LIDOCAINE   SPECIMEN:  No Specimen  DISPOSITION OF SPECIMEN:  N/A  COUNTS:  YES  TOURNIQUET:  * No tourniquets in log *  DICTATION: .Note written in EPIC  PLAN OF CARE: Admit to inpatient   PATIENT DISPOSITION:  PACU - hemodynamically stable.   Delay start of Pharmacological VTE agent (>24hrs) due to surgical blood loss or risk of bleeding: yes

## 2018-10-19 NOTE — Addendum Note (Signed)
Addendum  created 10/19/18 1611 by Julian Reil, CRNA   Intraprocedure Event edited

## 2018-10-19 NOTE — Anesthesia Postprocedure Evaluation (Signed)
Anesthesia Post Note  Patient: ELLA GUILLOTTE  Procedure(s) Performed: Thoracic Nine to Thoracic Eleven Laminectomy for ligation of dural arteriovenous fistula (N/A Spine Thoracic)     Patient location during evaluation: PACU Anesthesia Type: General Level of consciousness: awake and alert Pain management: pain level controlled Vital Signs Assessment: post-procedure vital signs reviewed and stable Respiratory status: spontaneous breathing, nonlabored ventilation, respiratory function stable and patient connected to nasal cannula oxygen Cardiovascular status: blood pressure returned to baseline and stable Postop Assessment: no apparent nausea or vomiting Anesthetic complications: no    Last Vitals:  Vitals:   10/19/18 1522 10/19/18 1537  BP: 108/89 112/78  Pulse: 94 99  Resp: 15 18  Temp:  36.4 C  SpO2: 97% 97%    Last Pain:  Vitals:   10/19/18 1537  TempSrc:   PainSc: Asleep                 Lizbet Cirrincione,W. EDMOND

## 2018-10-19 NOTE — Progress Notes (Signed)
Neurosurgery Service Post-operative progress note  Assessment & Plan: 47 y.o. man with thoracic AVF s/p laminectomy and ligation. Strength at baseline immediately post op as expected, recovering well. -admit to inpatient, PT/OT eval, pain control -activity as tolerated -given CSF drainage during surgery, can lay flat if headaches become bothersome  Jadene Pierini  10/19/18 2:46 PM

## 2018-10-19 NOTE — H&P (Signed)
Surgical H&P Update  HPI: 47 y.o. man with progressive thoracic myelopathy. Initial symptoms consisted of progresive BLE weakness and sacral root dysfunction;  radiographic workup revealed cord edema, angiogram confirmed a spinal dural AV fistula. No changes in health since he was last seen. Still having leg weakness and wishes to proceed with surgery.  PMHx:  Past Medical History:  Diagnosis Date  . Arthritis    lower back  . Degenerative joint disease   . Dural arteriovenous fistula   . Hypertension    FamHx: History reviewed. No pertinent family history. SocHx:  reports that he has never smoked. He has never used smokeless tobacco. He reports that he drinks about 1.0 standard drinks of alcohol per week. He reports that he has current or past drug history. Drug: Marijuana.  Physical Exam: AOx3, PERRL, FS, TM  SILT except diffusely decreased in left leg in an upper lumbar dermatomal distribution Strength 5/5 in BUE LLE 4-/5 RLE 4-/5 except EHL 3/5  Assesment/Plan: 47 y.o. man with thoracic spinal AV fistula, here for surgical ligation. Risks, benefits, and alternatives discussed and the patient would like to continue with surgery. -OR today -4NP post-op  Jadene Pierini, MD 10/19/18 11:14 AM

## 2018-10-19 NOTE — Anesthesia Preprocedure Evaluation (Addendum)
Anesthesia Evaluation  Patient identified by MRN, date of birth, ID band Patient awake    Reviewed: Allergy & Precautions, H&P , NPO status , Patient's Chart, lab work & pertinent test results  Airway Mallampati: II  TM Distance: >3 FB Neck ROM: Full    Dental no notable dental hx. (+) Partial Upper, Dental Advisory Given   Pulmonary neg pulmonary ROS,    Pulmonary exam normal breath sounds clear to auscultation       Cardiovascular hypertension, Pt. on medications  Rhythm:Regular Rate:Normal     Neuro/Psych negative neurological ROS  negative psych ROS   GI/Hepatic negative GI ROS, Neg liver ROS,   Endo/Other  Morbid obesity  Renal/GU negative Renal ROS  negative genitourinary   Musculoskeletal  (+) Arthritis , Osteoarthritis,    Abdominal   Peds  Hematology negative hematology ROS (+)   Anesthesia Other Findings   Reproductive/Obstetrics negative OB ROS                            Anesthesia Physical Anesthesia Plan  ASA: II  Anesthesia Plan: General   Post-op Pain Management:    Induction: Intravenous  PONV Risk Score and Plan: 3 and Ondansetron, Dexamethasone and Midazolam  Airway Management Planned: Oral ETT  Additional Equipment:   Intra-op Plan:   Post-operative Plan: Extubation in OR  Informed Consent: I have reviewed the patients History and Physical, chart, labs and discussed the procedure including the risks, benefits and alternatives for the proposed anesthesia with the patient or authorized representative who has indicated his/her understanding and acceptance.   Dental advisory given  Plan Discussed with: CRNA  Anesthesia Plan Comments:        Anesthesia Quick Evaluation

## 2018-10-19 NOTE — Op Note (Signed)
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 The patient was found to have diffuse cord edema concerning for a possible AVF, which was confirmed on catheter angiogram. This was discussed with the patient as well as risks, benefits, and alternatives and wished to proceed with surgical ligation of the fistula.   OPERATIVE DETAIL: The patient was taken to the operating room and placed on the OR table in the prone position. A formal time out was performed with two patient identifiers and confirmed the operative site. Anesthesia was induced by the anesthesia team. The operative site was marked, hair was clipped with surgical clippers, the area was then prepped and draped in a sterile fashion. The surgical level was localized with intraoperative fluoroscopy by counting up from the sacrum. A linear midline thoracic incision was created and subperiosteal dissection was performed to expose the T10 lamina. The surgical level was again confirmed with fluoroscopy and a T10 laminectomy was performed. A midline durotomy was created and arachnoid dissection was performed under the surgical microscope. A large dorsal dilated venous complex and feeding radicular artery were located and the fistulous point was isolated. ICG angiography was performed to confirm the suspected fistulous point and confirmed arterial flow with early venous drainage in the area of suspected fistula. This was then clipped using an aneurysm mini-clip and SSEPs/MEPs were repeated to confirm a  lack of change with occlusion. While awaiting a change in neuromonitoring, large dilated venous complex appeared to thrombose, as expected. The fistulous point was then coagulated and ligated. Hemostasis was confirmed, the dura was closed in a watertight fashion and fibrin glue was applied to assist with watertight closure. All instrument and sponge counts were correct, the incision was then closed in layers. The patient was then returned to anesthesia for emergence. No apparent complications at the completion of the procedure.   EBL:    DRAINS: none   SPECIMENS: none   Jadene Pierini, MD 10/19/18 2:26 PM

## 2018-10-20 ENCOUNTER — Other Ambulatory Visit: Payer: Self-pay

## 2018-10-20 ENCOUNTER — Encounter (HOSPITAL_COMMUNITY): Payer: Self-pay | Admitting: Neurological Surgery

## 2018-10-20 MED ORDER — OXYCODONE HCL 5 MG PO TABS: 5.0000 mg | ORAL_TABLET | ORAL | 0 refills | Status: AC | PRN

## 2018-10-20 NOTE — Progress Notes (Signed)
Patient d/c instructions given, including prescription. IV removed. Questions answered. Patient with all belongings.  No equipment to give.  Patient to car via wheelchair.

## 2018-10-20 NOTE — Social Work (Signed)
CSW acknowledging consult for SNF placement. Will follow for therapy recommendations.   Marvette Schamp H Anamae Rochelle, LCSWA [facility] [phone]   

## 2018-10-20 NOTE — Discharge Instructions (Signed)
Discharge Instructions  No restriction in activities, slowly increase your activity back to normal.   Your incision is closed with absorbable sutures. These will fall out naturally over the next 4-6 weeks. If they become stiff or bothersome, rub some bacitracin or neosporin ointment on them to soften them up.   Okay to shower on the day of discharge. Be gentle when cleaning your incision. Use regular soap and water. If that is uncomfortable, try using baby shampoo. Do not submerge the wound under water for 2 weeks after surgery.  Call my office if you have any new concerns or questions [phone]. See me in the office in 2 weeks for a post-op check. If you do not already have an appointment, please call the office to schedule one.

## 2018-10-20 NOTE — Discharge Summary (Signed)
Discharge Summary  Date of Admission: 10/19/2018  Date of Discharge: 10/20/18  Attending Physician: Autumn Patty, MD  Hospital Course: Patient was admitted following an uncomplicated thoracic laminectomy for ligation of a dural AV fistula. He was recovered in PACU and transferred to 4N. His hospital course was uncomplicated. On POD1, his strength was already improving, he was tolerating a regular oral diet and ambulating at his baseline with good pain control. He was therefore discharged home. He will follow up in clinic with me in 2 weeks.  Neurologic exam at discharge:  AOx3, PERRL, EOMI, FS, TM Strength 5/5 in BUE, 4+/5 in LLE, 4/5 in RLE, SILTx4 Incision c/d/i  Jadene Pierini, MD 10/20/18 8:54 AM

## 2018-10-20 NOTE — Progress Notes (Signed)
Late entry  Patient arrived to unit. Vitals stable. A&Ox4. Family at bedside, no complaints of pain. Oriented to unit. Diet order placed.

## 2018-10-20 NOTE — Evaluation (Signed)
Physical Therapy Evaluation Patient Details Name: Derrick Wall MRN: [id number] DOB: 1971-05-19 Today's Date: 10/20/2018   History of Present Illness   Patient is 47 yo male s/p thoracic laminectomy for ligation of a dural AV fistula. PMH: arthritis, htn.  Clinical Impression  Pt tolerating mobility well. Pt has never been in hospital before and is very anxious, wants to leave ASAP. Pt given hand out on back precautions and was able amb and complete stair negotiation with min guard. Pt with difficulty with LB ADLs, reports "that's what my girlfriend is for". Educated pt on importance of moving towards indep and that OT will be in to assist with modifications to ADLs. Acute PT to cont to follow.    Follow Up Recommendations No PT follow up;Supervision - Intermittent    Equipment Recommendations  None recommended by PT    Recommendations for Other Services       Precautions / Restrictions Precautions Precautions: Back Precaution Booklet Issued: Yes (comment) Precaution Comments: pt with verbal understanding Restrictions Weight Bearing Restrictions: No      Mobility  Bed Mobility               General bed mobility comments: discussed log roll technique, PT demonstrated. Pt up in chair upon PT arrival and refused to practice bed mobility with PT  Transfers Overall transfer level: Needs assistance Equipment used: None Transfers: Sit to/from Stand Sit to Stand: Min guard         General transfer comment: pt slid to edge, verbal cues to scoot back slightly to be able to bend knees and place feet back to push up, pt instructed not to pull up on rolling walker and to push up from arm rests  Ambulation/Gait Ambulation/Gait assistance: Supervision;Independent;Modified independent (Device/Increase time) Gait Distance (Feet): 225 Feet Assistive device: None Gait Pattern/deviations: Step-through pattern;Decreased stride length;Wide base of support Gait velocity:  slow Gait velocity interpretation: 1.31 - 2.62 ft/sec, indicative of limited community ambulator General Gait Details: pt guarded, slow, no episodes of LOB, verbal cues to not twist while walking  Stairs Stairs: Yes Stairs assistance: Min guard Stair Management: One rail Left;Step to pattern;Sideways Number of Stairs: 2(to mimic home) General stair comments: pt with more difficulty ascending than descending  Wheelchair Mobility    Modified Rankin (Stroke Patients Only)       Balance Overall balance assessment: Mild deficits observed, not formally tested                                           Pertinent Vitals/Pain Pain Assessment: 0-10 Pain Score: 3  Pain Location: back, surgical site Pain Descriptors / Indicators: Aching;Constant Pain Intervention(s): Monitored during session    Home Living Family/patient expects to be discharged to:: Private residence Living Arrangements: Spouse/significant other Available Help at Discharge: Family;Available PRN/intermittently(girlfriend cleans houses) Type of Home: House Home Access: Stairs to enter Entrance Stairs-Rails: Left Entrance Stairs-Number of Steps: 2 Home Layout: One level Home Equipment: Shower seat(3 wheeled walker with brakes)      Prior Function Level of Independence: Independent(until a few weeks ago)         Comments: due ot pain in back and weakness in LEs pt was using w/c and walker for mobility, pt works as a Production designer, theatre/television/film at a [employer], music   Dominant Hand: Right    Extremity/Trunk Assessment  Upper Extremity Assessment Upper Extremity Assessment: Defer to OT evaluation    Lower Extremity Assessment Lower Extremity Assessment: Generalized weakness(grossly 4+/5 bilaterally, no report of sensation loss)    Cervical / Trunk Assessment Cervical / Trunk Assessment: Other exceptions Cervical / Trunk Exceptions: recent back surgedry  Communication   Communication: No  difficulties  Cognition Arousal/Alertness: Awake/alert Behavior During Therapy: Anxious(reports nervous about being in hospital) Overall Cognitive Status: Within Functional Limits for tasks assessed                                        General Comments General comments (skin integrity, edema, etc.): VSS    Exercises     Assessment/Plan    PT Assessment Patient needs continued PT services  PT Problem List Decreased strength;Decreased range of motion;Decreased activity tolerance;Decreased balance;Decreased mobility;Decreased coordination;Decreased cognition;Decreased knowledge of use of DME;Decreased safety awareness;Pain       PT Treatment Interventions DME instruction;Gait training;Stair training;Functional mobility training;Therapeutic activities;Therapeutic exercise;Balance training    PT Goals (Current goals can be found in the Care Plan section)  Acute Rehab PT Goals Patient Stated Goal: home asap PT Goal Formulation: With patient Time For Goal Achievement: 11/03/18 Potential to Achieve Goals: Good    Frequency Min 3X/week   Barriers to discharge        Co-evaluation               AM-PAC PT "6 Clicks" Daily Activity  Outcome Measure Difficulty turning over in bed (including adjusting bedclothes, sheets and blankets)?: Unable Difficulty moving from lying on back to sitting on the side of the bed? : Unable Difficulty sitting down on and standing up from a chair with arms (e.g., wheelchair, bedside commode, etc,.)?: Unable Help needed moving to and from a bed to chair (including a wheelchair)?: A Little Help needed walking in hospital room?: A Little Help needed climbing 3-5 steps with a railing? : A Little 6 Click Score: 12    End of Session   Activity Tolerance: Patient tolerated treatment well Patient left: in chair;with call bell/phone within reach Nurse Communication: Mobility status PT Visit Diagnosis: Unsteadiness on feet  (R26.81);Pain Pain - part of body: (back)    Time: 2841-3244 PT Time Calculation (min) (ACUTE ONLY): 20 min   Charges:   PT Evaluation $PT Eval Moderate Complexity: 1 Mod          Lewis Shock, PT, DPT Acute Rehabilitation Services Pager #: [phone] Office #: [phone]   Iona Hansen 10/20/2018, 9:55 AM

## 2018-10-20 NOTE — Evaluation (Signed)
Occupational Therapy Evaluation Patient Details Name: Derrick Wall MRN: [id number] DOB: December 18, 1971 Today's Date: 10/20/2018    History of Present Illness  Patient is 47 yo male s/p thoracic laminectomy for ligation of a dural AV fistula. PMH: arthritis, htn.   Clinical Impression   This 47 y/o male presents with the above. At baseline pt is independent with ADLs and functional mobility. Pt demonstrating functional mobility without AD and overall minguard assist. He currently requires minA for LB ADLs secondary to pain and adhering to back precautions. Pt reports he will return home with his significant other who can assist with ADLs PRN. Educated pt on safety, AE, compensatory strategies for completing ADLs while maintaining back precautions with pt verbalizing and return demonstrating understanding throughout. Will continue to follow while pt remains in acute setting to progress pt towards PLOF. Pt anticipating d/c home later today.     Follow Up Recommendations  No OT follow up;Supervision/Assistance - 24 hour(24hr initially)    Equipment Recommendations  None recommended by OT           Precautions / Restrictions Precautions Precautions: Back Precaution Booklet Issued: Yes (comment) Precaution Comments: pt with verbal understanding Restrictions Weight Bearing Restrictions: No      Mobility Bed Mobility               General bed mobility comments: OOB in recliner upon arrival  Transfers Overall transfer level: Needs assistance Equipment used: None Transfers: Sit to/from Stand Sit to Stand: Min guard         General transfer comment: increased time/effort due to pain with transitions; no physical assist rqeuired    Balance Overall balance assessment: Mild deficits observed, not formally tested                                         ADL either performed or assessed with clinical judgement   ADL Overall ADL's : Needs  assistance/impaired Eating/Feeding: Independent;Sitting   Grooming: Min guard;Sitting;Standing   Upper Body Bathing: Min guard;Sitting   Lower Body Bathing: Min guard;Sit to/from stand;With adaptive equipment Lower Body Bathing Details (indicate cue type and reason): educated on use of LH sponge to increase independence with task; educated on use of shower chair for increased safety as well, pt verbalizing understanding Upper Body Dressing : Set up;Sitting   Lower Body Dressing: Minimal assistance;Sit to/from stand Lower Body Dressing Details (indicate cue type and reason): minA due to pain/back precautions. educated on use of reacher for completing LB dressing tasks, pt declined education on sock aide reporting his girlfirend will assist with this  Toilet Transfer: Min guard;Ambulation;Regular Social worker and Hygiene: Min guard;Sit to/from stand       Functional mobility during ADLs: Min guard General ADL Comments: educated on safety, AE, compensatory strategies for completing ADLs while maintaining back precautions     Vision         Perception     Praxis      Pertinent Vitals/Pain Pain Assessment: 0-10 Pain Score: 4  Pain Location: back, surgical site Pain Descriptors / Indicators: Aching;Constant Pain Intervention(s): Monitored during session;Limited activity within patient's tolerance     Hand Dominance Right   Extremity/Trunk Assessment Upper Extremity Assessment Upper Extremity Assessment: Overall WFL for tasks assessed   Lower Extremity Assessment Lower Extremity Assessment: Defer to PT evaluation   Cervical / Trunk Assessment Cervical /  Trunk Assessment: Other exceptions Cervical / Trunk Exceptions: recent back surgedry   Communication Communication Communication: No difficulties   Cognition Arousal/Alertness: Awake/alert Behavior During Therapy: WFL for tasks assessed/performed Overall Cognitive Status: Within Functional  Limits for tasks assessed                                     General Comments  VSS    Exercises     Shoulder Instructions      Home Living Family/patient expects to be discharged to:: Private residence Living Arrangements: Spouse/significant other Available Help at Discharge: Family;Available PRN/intermittently(girlfriend cleans houses) Type of Home: House Home Access: Stairs to enter [employer] of Steps: 2 Entrance Stairs-Rails: Left Home Layout: One level     Bathroom Shower/Tub: Tub/shower unit;Walk-in Human resources officer: Standard     Home Equipment: Shower seat;Hand held shower head(3 wheeled walker with brakes)          Prior Functioning/Environment Level of Independence: Independent(until a few weeks ago)        Comments: due to pain in back and weakness in LEs pt was using w/c and walker for mobility, pt works as a Production designer, theatre/television/film at a [employer], clinical (histocompatibility and immunogenetics) Problem List: Decreased strength;Decreased range of motion;Decreased activity tolerance;Decreased knowledge of precautions      OT Treatment/Interventions: Self-care/ADL training;Therapeutic exercise;Neuromuscular education;DME and/or AE instruction;Therapeutic activities;Patient/family education;Balance training    OT Goals(Current goals can be found in the care plan section) Acute Rehab OT Goals Patient Stated Goal: home asap OT Goal Formulation: With patient Time For Goal Achievement: 11/03/18 Potential to Achieve Goals: Good  OT Frequency: Min 2X/week   Barriers to D/C:            Co-evaluation              AM-PAC PT "6 Clicks" Daily Activity     Outcome Measure Help from another person eating meals?: None Help from another person taking care of personal grooming?: None Help from another person toileting, which includes using toliet, bedpan, or urinal?: None Help from another person bathing (including washing, rinsing, drying)?: None Help from another  person to put on and taking off regular upper body clothing?: None Help from another person to put on and taking off regular lower body clothing?: A Little 6 Click Score: 23   End of Session Nurse Communication: Mobility status  Activity Tolerance: Patient tolerated treatment well Patient left: in chair;with call bell/phone within reach  OT Visit Diagnosis: Other abnormalities of gait and mobility (R26.89);Pain Pain - part of body: (back)                Time: 1010-1031 OT Time Calculation (min): 21 min Charges:  OT General Charges $OT Visit: 1 Visit OT Evaluation $OT Eval Low Complexity: 1 Low  Marcy Siren, OT Supplemental Rehabilitation Services Pager [phone] Office [phone]   Orlando Penner 10/20/2018, 12:06 PM

## 2018-10-20 NOTE — Progress Notes (Signed)
Neurosurgery Service Progress Note  Subjective: No acute events overnight. Feels that RLE strength is already significantly improved. Stood up with his walker to give me a hug this morning, feeling great   Objective: Vitals:   10/19/18 2336 10/20/18 0025 10/20/18 0416 10/20/18 0822  BP: (!) 134/96 (!) 126/97 (!) 123/97 (!) 134/100  Pulse: (!) 110 93 87 98  Resp:      Temp: 98.4 F (36.9 C) 98.5 F (36.9 C) 98 F (36.7 C) 98 F (36.7 C)  TempSrc: Oral Oral Oral Oral  SpO2: 95% 91% 93% 92%  Weight:      Height:       Temp (24hrs), Avg:98 F (36.7 C), Min:97.6 F (36.4 C), Max:98.5 F (36.9 C)  CBC Latest Ref Rng & Units 10/15/2018 10/04/2018  WBC 4.0 - 10.5 K/uL 7.7 6.5  Hemoglobin 13.0 - 17.0 g/dL 16.1 09.6  Hematocrit 04.5 - 52.0 % 46.1 46.7  Platelets 150 - 400 K/uL 291 199   BMP Latest Ref Rng & Units 10/15/2018  Glucose 70 - 99 mg/dL 409(W)  BUN 6 - 20 mg/dL 8  Creatinine 1.19 - 1.47 mg/dL 8.29(F)  Sodium 621 - 308 mmol/L 139  Potassium 3.5 - 5.1 mmol/L 3.7  Chloride 98 - 111 mmol/L 103  CO2 22 - 32 mmol/L 25  Calcium 8.9 - 10.3 mg/dL 9.3    Intake/Output Summary (Last 24 hours) at 10/20/2018 1101 Last data filed at 10/20/2018 0800 Gross per 24 hour  Intake 3334.01 ml  Output 4640 ml  Net -1305.99 ml    Current Facility-Administered Medications:  .  0.9 %  sodium chloride infusion, 250 mL, Intravenous, Continuous, Ostergard, Thomas A, MD .  0.9 %  sodium chloride infusion, , Intravenous, Continuous, Ostergard, Clovis Pu, MD, Last Rate: 75 mL/hr at 10/19/18 1905 .  acetaminophen (TYLENOL) tablet 650 mg, 650 mg, Oral, Q4H PRN, 650 mg at 10/19/18 2219 **OR** acetaminophen (TYLENOL) suppository 650 mg, 650 mg, Rectal, Q4H PRN, Jadene Pierini, MD .  amLODipine (NORVASC) tablet 10 mg, 10 mg, Oral, Daily, Ostergard, Thomas A, MD, 10 mg at 10/20/18 1014 .  docusate sodium (COLACE) capsule 100 mg, 100 mg, Oral, BID, Jadene Pierini, MD, 100 mg at 10/20/18  1014 .  HYDROmorphone (DILAUDID) injection 0.5 mg, 0.5 mg, Intravenous, Q3H PRN, Jadene Pierini, MD .  losartan (COZAAR) tablet 25 mg, 25 mg, Oral, Daily, Ostergard, Thomas A, MD, 25 mg at 10/20/18 1014 .  menthol-cetylpyridinium (CEPACOL) lozenge 3 mg, 1 lozenge, Oral, PRN **OR** phenol (CHLORASEPTIC) mouth spray 1 spray, 1 spray, Mouth/Throat, PRN, Ostergard, Thomas A, MD .  ondansetron (ZOFRAN) tablet 4 mg, 4 mg, Oral, Q6H PRN **OR** ondansetron (ZOFRAN) injection 4 mg, 4 mg, Intravenous, Q6H PRN, Ostergard, Thomas A, MD .  oxyCODONE (Oxy IR/ROXICODONE) immediate release tablet 10 mg, 10 mg, Oral, Q3H PRN, Jadene Pierini, MD, 10 mg at 10/19/18 2219 .  oxyCODONE (Oxy IR/ROXICODONE) immediate release tablet 5 mg, 5 mg, Oral, Q3H PRN, Jadene Pierini, MD .  polyethylene glycol (MIRALAX / GLYCOLAX) packet 17 g, 17 g, Oral, Daily PRN, Ostergard, Thomas A, MD .  sodium chloride flush (NS) 0.9 % injection 3 mL, 3 mL, Intravenous, Q12H, Ostergard, Thomas A, MD, 3 mL at 10/20/18 1015 .  sodium chloride flush (NS) 0.9 % injection 3 mL, 3 mL, Intravenous, PRN, Jadene Pierini, MD .  tamsulosin (FLOMAX) capsule 0.4 mg, 0.4 mg, Oral, Daily, Ostergard, Thomas A, MD, 0.4 mg at 10/20/18 1013  Physical Exam: AOx3, PERRL, EOMI, FS, TM,  Strength 5/5 in BUE, LLE 4+/5, RLE 4/5 Sensation to light touch grossly intact Incision c/d/i  Assessment & Plan: 47 y.o. man s/p thoracic laminectomy for ligation of dural AVF, recovering very well. -discharge home this morning  Jadene Pierini, MD 10/20/18 11:01 AM

## 2018-11-05 ENCOUNTER — Other Ambulatory Visit: Payer: Self-pay | Admitting: Neurological Surgery

## 2018-11-05 DIAGNOSIS — M545 Low back pain, unspecified: Secondary | ICD-10-CM

## 2018-11-05 DIAGNOSIS — G8929 Other chronic pain: Secondary | ICD-10-CM

## 2018-11-16 ENCOUNTER — Ambulatory Visit
Admission: RE | Admit: 2018-11-16 | Discharge: 2018-11-16 | Disposition: A | Discharge status: Home / Self Care | Payer: PRIVATE HEALTH INSURANCE | Source: Ambulatory Visit | Attending: Neurological Surgery | Admitting: Neurological Surgery

## 2018-11-16 DIAGNOSIS — M545 Low back pain, unspecified: Secondary | ICD-10-CM

## 2018-11-16 DIAGNOSIS — G8929 Other chronic pain: Secondary | ICD-10-CM

## 2018-11-16 MED ORDER — IOPAMIDOL (ISOVUE-M 200) INJECTION 41%
1.0000 mL | Freq: Once | INTRAMUSCULAR | Status: AC
  Administered 2018-11-16: 1 mL via EPIDURAL

## 2018-11-16 MED ORDER — METHYLPREDNISOLONE ACETATE 40 MG/ML INJ SUSP (RADIOLOG
120.0000 mg | Freq: Once | INTRAMUSCULAR | Status: AC
  Administered 2018-11-16: 120 mg via EPIDURAL

## 2018-11-16 NOTE — Discharge Instructions (Signed)

## 2018-12-01 ENCOUNTER — Other Ambulatory Visit: Payer: Self-pay | Admitting: Neurological Surgery

## 2018-12-01 DIAGNOSIS — M545 Low back pain, unspecified: Secondary | ICD-10-CM

## 2018-12-16 ENCOUNTER — Other Ambulatory Visit: Payer: PRIVATE HEALTH INSURANCE

## 2019-08-08 IMAGING — XA IR CAROTID INTERNAL HEAD/NECK BILAT  (MS)
11 of 24 series · 11 of 24 positions shown · IV contrast (IODINE)
Comparison: none

ADDENDUM:
INTERPRETATION:

Right internal carotid artery:
Injection reveals the presence of a widely patent right internal
carotid artery, with normal middle and anterior cerebral arteries
and distal branches. There is no early venous drainage identified.
No intracranial aneurysms are seen. Capillary phase does not
demonstrate any perfusion deficits. Venous phase is unremarkable.
Left internal carotid artery:
The left internal carotid artery is widely patent with normal
termination into middle and anterior cerebral arteries. No
intracranial aneurysms, AVMs, or high-flow fistulas are seen. There
is no early venous drainage. Capillary phase is unremarkable. Venous
sinuses are patent.
Left vertebral artery:
Injection reveals widely patent left vertebral artery and basilar
artery. The basilar bifurcation is normal. No intracranial aneurysms
or AVMs are seen. There is no early venous drainage within the
spinal canal. Capillary phase is unremarkable. Venous sinuses are
patent.
PROCEDURE:
DIAGNOSTIC CEREBRAL/SPINAL ANGIOGRAM
HISTORY: The patient is a 47-year-old man with several month history of
slowly progressive thoracic myelopathy. His MRI demonstrated
significant mid to lower thoracic spinal cord edema involving the
conus medullaris. There was suggestion of some flow voids on the
MRI. Given his clinical presentation and imaging findings, there was
concern for spinal fistula. He therefore presents today for
diagnostics cerebral and spinal angiogram.
TECHNIQUE: CATHETERS AND WIRES
5-French 80cm Asen catheter

[Series 2: cerebral care 2 · 2 acquisitions, 1 frame shown (1 of 11)]
[im 1/2]
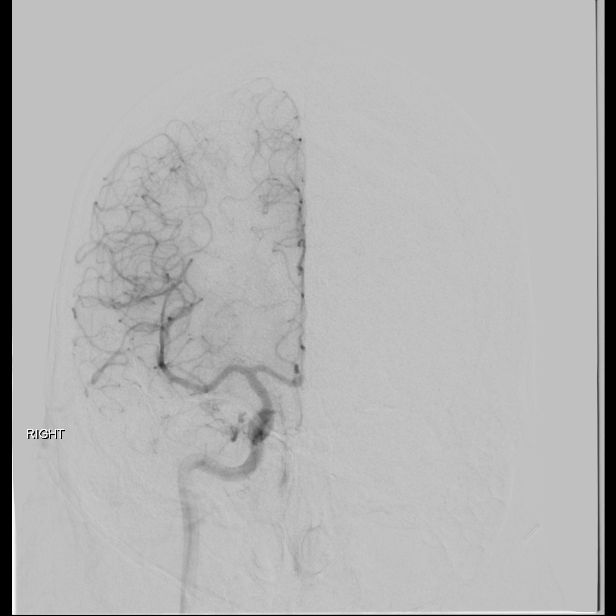

[Series 4: cerebral care 2 · 2 acquisitions, 1 frame shown (2 of 11)]
[im 1/2]
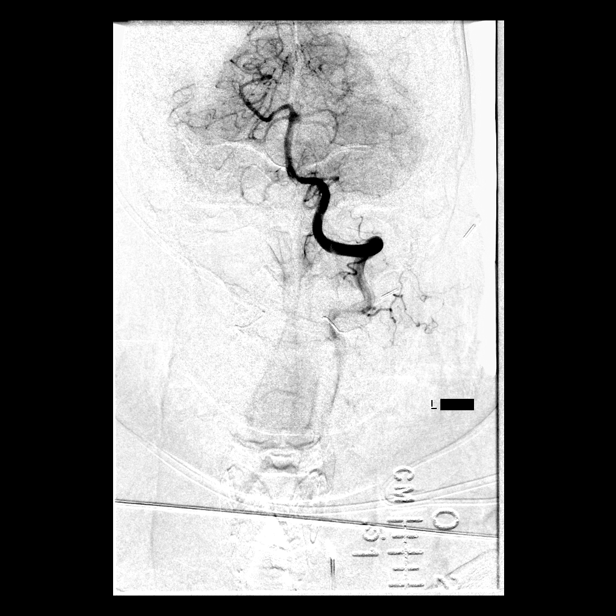

[Series 6: cerebral care 2 · 2 acquisitions, 1 frame shown (3 of 11)]
[im 1/2]
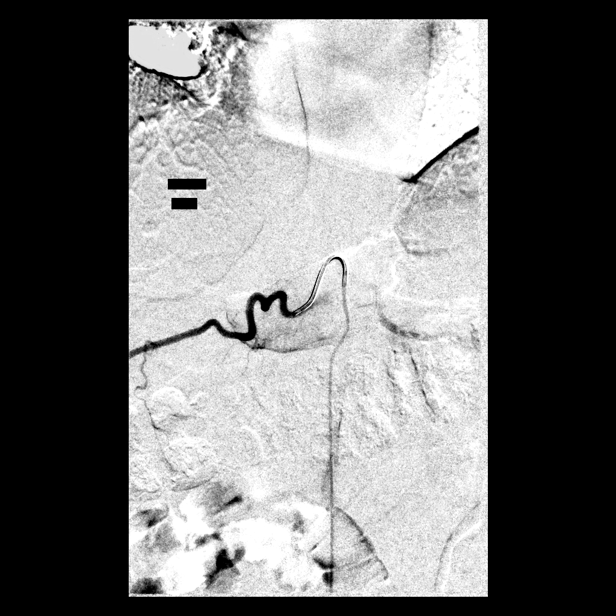

[Series 8: cerebral care 2 · 2 acquisitions, 1 frame shown (4 of 11)]
[im 1/2]
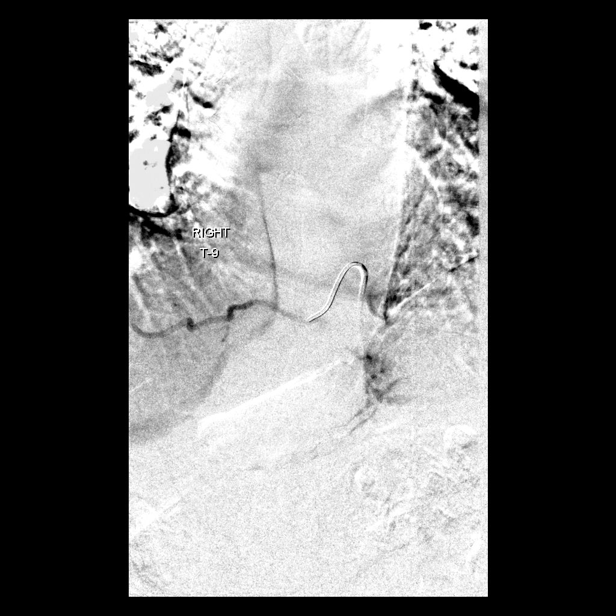

[Series 10: cerebral care 2 · 1 of 18 frames shown (5 of 11)]
[frame 10/18]
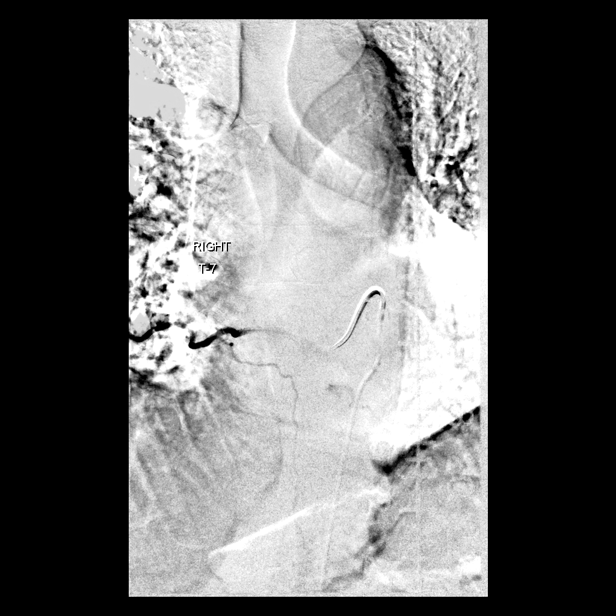

[Series 13: cerebral care 2 · 1 of 15 frames shown (6 of 11)]
[frame 8/15]
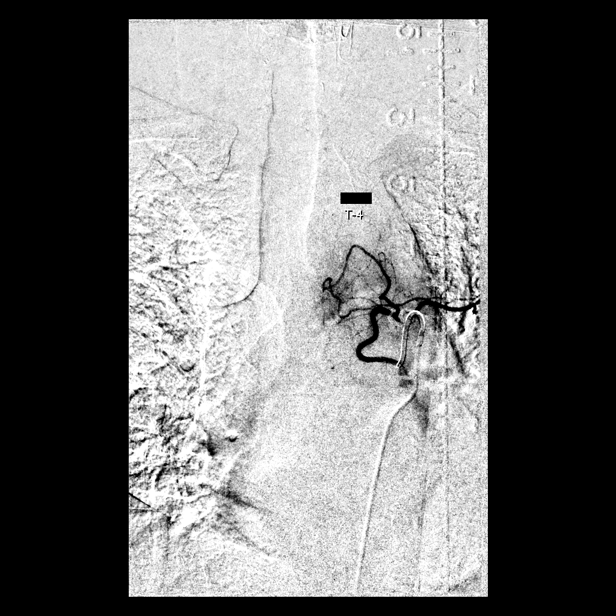

[Series 15: cerebral care 2 · 1 of 16 frames shown (7 of 11)]
[frame 14/16]
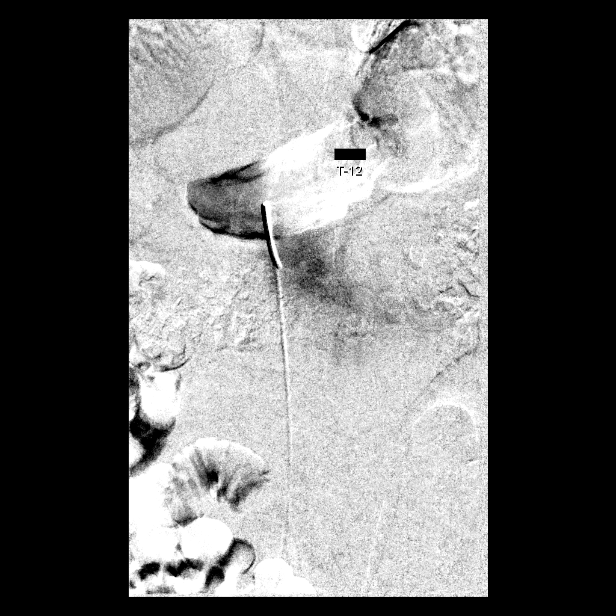

[Series 17: cerebral care 2 · 1 of 24 frames shown (8 of 11)]
[frame 21/24]
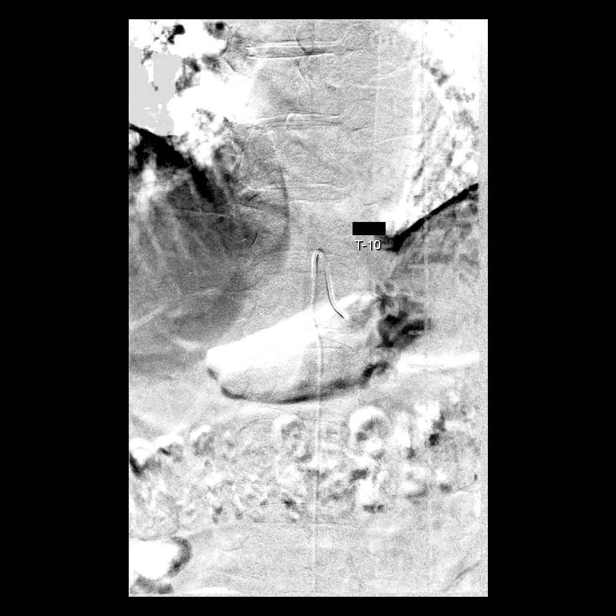

[Series 19: cerebral care 2 · 1 of 35 frames shown (9 of 11)]
[frame 30/35]
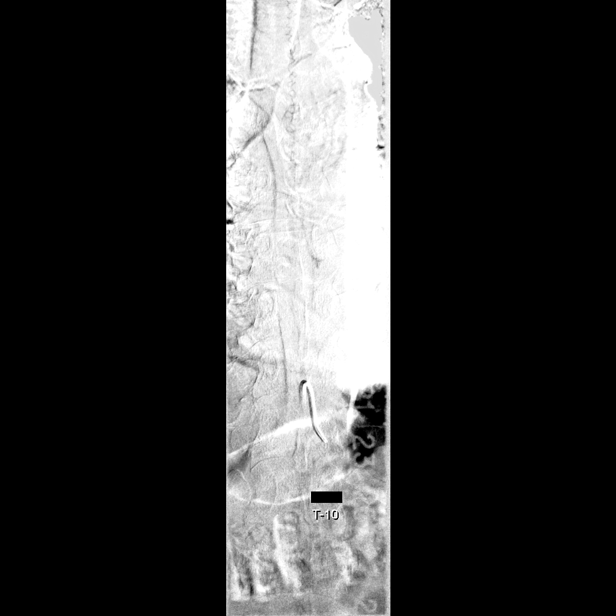

[Series 21: cerebral care 2 · 1 of 16 frames shown (10 of 11)]
[frame 16/16]
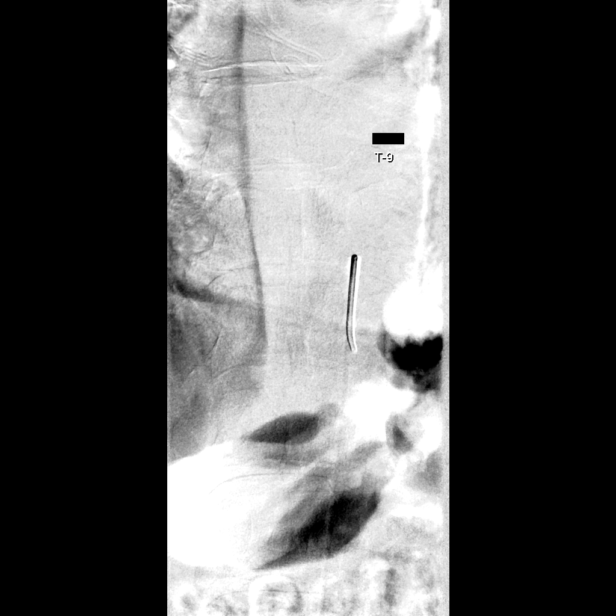

[Series 23: cerebral care 2 · 1 of 19 frames shown (11 of 11)]
[frame 17/19]
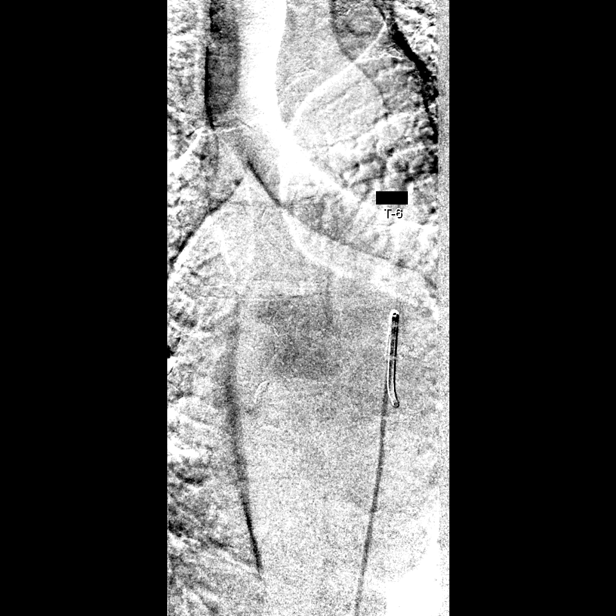

[11 of 24 positions shown; findings below may reference images not displayed]

ACCESS:
The technical aspects of the procedure as well as its potential
risks and benefits were reviewed with the patient. These risks
included but were not limited bleeding, infection, allergic
reaction, damage to organs or vital structures, stroke,
non-diagnostic procedure, and the catastrophic outcomes of heart
attack, coma, and death. With an understanding of these risks,
informed consent was obtained and witnessed. The patient was placed
in the supine position on the angiography table and the skin of
right groin prepped in the usual sterile fashion. The procedure was
performed under local anesthesia (1%-solution of
bicarbonate-buffered Lidocaine) and conscious sedation with Versed
and fentanyl monitored by the in-suite nurse. A 5- French sheath was
introduced in the right common femoral artery using Seldinger
technique. A fluoro-phase sequence was used to document the sheath
position.

MEDICATIONS:
HEPARIN: 2777 Units total.

CONTRAST:  cc, Omnipaque 300

FLUOROSCOPY TIME:  FLUOROSCOPY TIME: See IR records
5-French JB-1 glidecatheter

0.035" glidewire

VESSELS CATHETERIZED
Right internal carotid

Left internal carotid

Left vertebral

Left T4

Right / Left T5

Right / Left T6

Right / Left T7

Right / Left T8

Right / Left T9

Right / Left T10

Right / Left T11

Right / Left T12

Right common femoral

VESSELS STUDIED
Right internal carotid, head

Left internal carotid, head

Left vertebral

Left T4: AP

Right / Left T5: AP

Right / Left T6: AP

Right / Left T7: AP

Right / Left T8: AP

Right / Left T9: AP

Right / Left T10: AP

Right / Left T11: AP

Right / Left T12: AP

Right femoral

PROCEDURAL NARRATIVE
A 5-Fr JB-1 catheter was advanced over a 0.035 glidewire into the
aortic arch. The above cranial vessels were then sequentially
catheterized and cervical / cerebral angiograms taken. After review
of images, the catheter was removed without incident. The Mickelson
catheter was then introduced over the wire and reformed over the
aortic arch. The right-sided radicles were then catheterized with
angiograms taken. The same procedure was repeated for the left-sided
radicles, with special attention paid to the fistulous connection at
left T10. After review of the images, the Mickelson catheter was
removed without incident.
FINDINGS: Left T4:

Normal segmental vessel. No contribution to the anterior spinal
artery is seen.

Right T5:

Normal segmental vessel. No contribution to the anterior spinal
artery is seen.

Left T5:

Normal segmental vessel. No contribution to the anterior spinal
artery is seen.

Right T6:

Normal segmental vessel. No contribution to the anterior spinal
artery is seen. however there may be contribution to the posterior
spinal artery at this level.

Left T6:

Normal segmental vessel. No contribution to the anterior spinal
artery is seen

Right T7:

Normal segmental vessel. No contribution to the anterior spinal
artery is seen, however there may be contribution to the posterior
spinal artery at this level.

Left T7:

Normal segmental vessel. No contribution to the anterior spinal
artery is seen

Right T8:

Normal segmental vessel. No contribution to the anterior spinal
artery is seen.

Left T8:

Normal segmental vessel. No contribution to the anterior spinal
artery is seen.

Right T9:

Normal segmental vessel. No contribution to the anterior spinal
artery is seen.

Left T9:

Normal segmental vessel. No contribution to the anterior spinal
artery is seen.

Right T10:

Normal segmental vessel. No contribution to the anterior spinal
artery is seen.

Left T10:

Normal segmental vessel. There is early opacification of the
perimedullary veins, with superiorly directed contrast reflux within
these perimedullary veins at least to the level of the cervical
spinal cord suggesting dural fistulous connection at this level.
Some contrast reflux is also seen draining inferiorly. There is no
clear contribution to the anterior spinal artery at this level.

Right T11:

Normal segmental vessel. No contribution to the anterior spinal
artery is seen.

Left T11:

Normal segmental vessel. No contribution to the anterior spinal
artery is seen

Right T12:

Normal segmental vessel. No contribution to the anterior spinal
artery is seen

Left T12:

Normal segmental vessel. No contribution to the anterior spinal
artery is seen

Right femoral:

Normal vessel. No significant atherosclerotic disease. Arterial
sheath in adequate position.

DISPOSITION:
Upon completion of the study, the femoral sheath was removed and
hemostasis obtained using a 5-Fr ExoSeal closure device. Good
proximal and distal lower extremity pulses were documented upon
achievement of hemostasis. The procedure was well tolerated and no
early complications were observed. The patient was transferred to
the holding area to lay flat for 2 hours.
IMPRESSION: 1. Early opacification and significant reflux through the
perimedullary venous plexus supplied by the left T10 segmental
vessel suggesting dural AV fistula at this level.

2. No clear supply to the anterior spinal artery is seen from the
thoracic segmental vessels that were catheterized. There is possible
contribution to the posterior spinal artery from the right T6 and
right T7 segmentals.

The preliminary results of this procedure were shared with the
patient and the patient's family.

## 2019-08-23 IMAGING — RF DG C-ARM 61-120 MIN
1 series · 2 of 2 positions shown · non-contrast
Comparison: None

CLINICAL DATA: Localization for laminectomy

EXAM:
THORACIC SPINE - 1 VIEW; DG C-ARM 61-120 MIN

[Series 1: run · 2 of 2 slices shown]
[im 1/2]
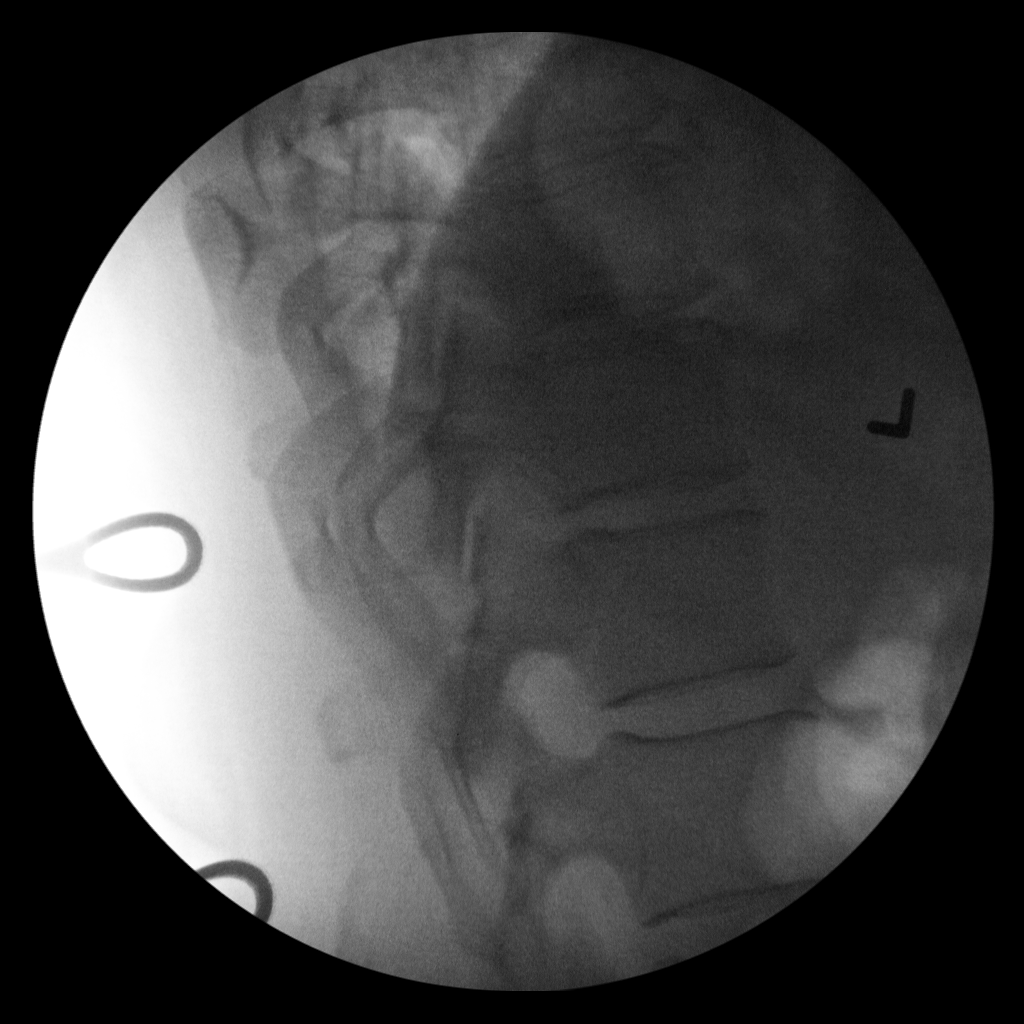
[im 2/2]
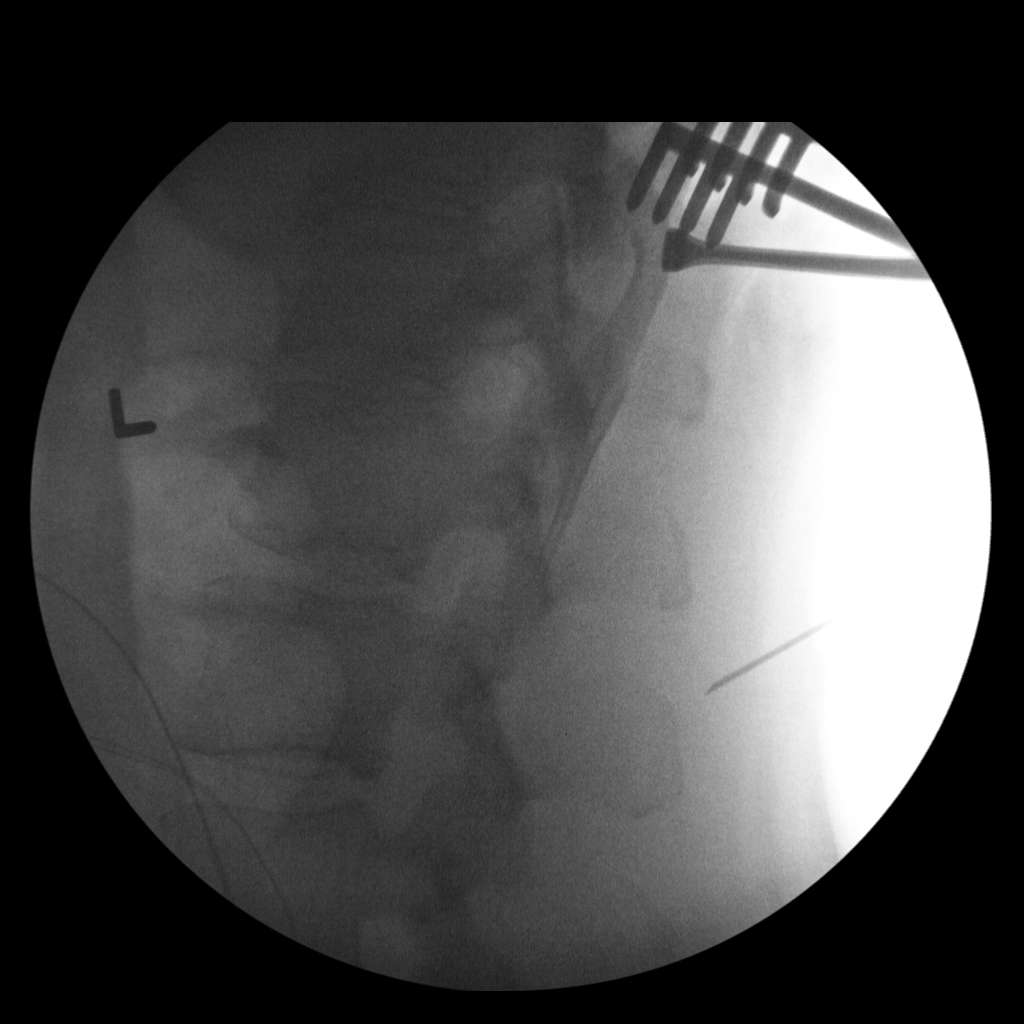

[2 of 2 positions shown; findings below may reference images not displayed]

FLUOROSCOPY TIME:  Fluoroscopy Time:  19 seconds

Radiation Exposure Index (if provided by the fluoroscopic device):
Not available

Number of Acquired Spot Images: 2
FINDINGS: Two spot films were obtained and reveal the initial lateral image 2
shows surgical instruments over the lower thoracic spine. Actual
numbering cannot be performed on this limited image. Subsequent
image demonstrates retractors in the lower thoracic spine with
subsequent needle in more inferior soft tissues. Correlation with
the operative findings is recommended.
IMPRESSION: Intraoperative localization.

## 2019-09-20 IMAGING — XA Imaging study
2 series · 2 of 2 positions shown · non-contrast
Comparison: none

CLINICAL DATA: Chronic low back pain, unspecified back pain
laterality, unspecified whether sciatica present. Patient states
that low back pain extends into the right lower extremity. Bilateral
facet arthropathy and displacement of the L4-5 lumbar disc. Patient
was recently treated for lower thoracic dural AVM.

[Series 1: ortho standard · 1 of 1 slices shown (1 of 2)]
[im 1/1]
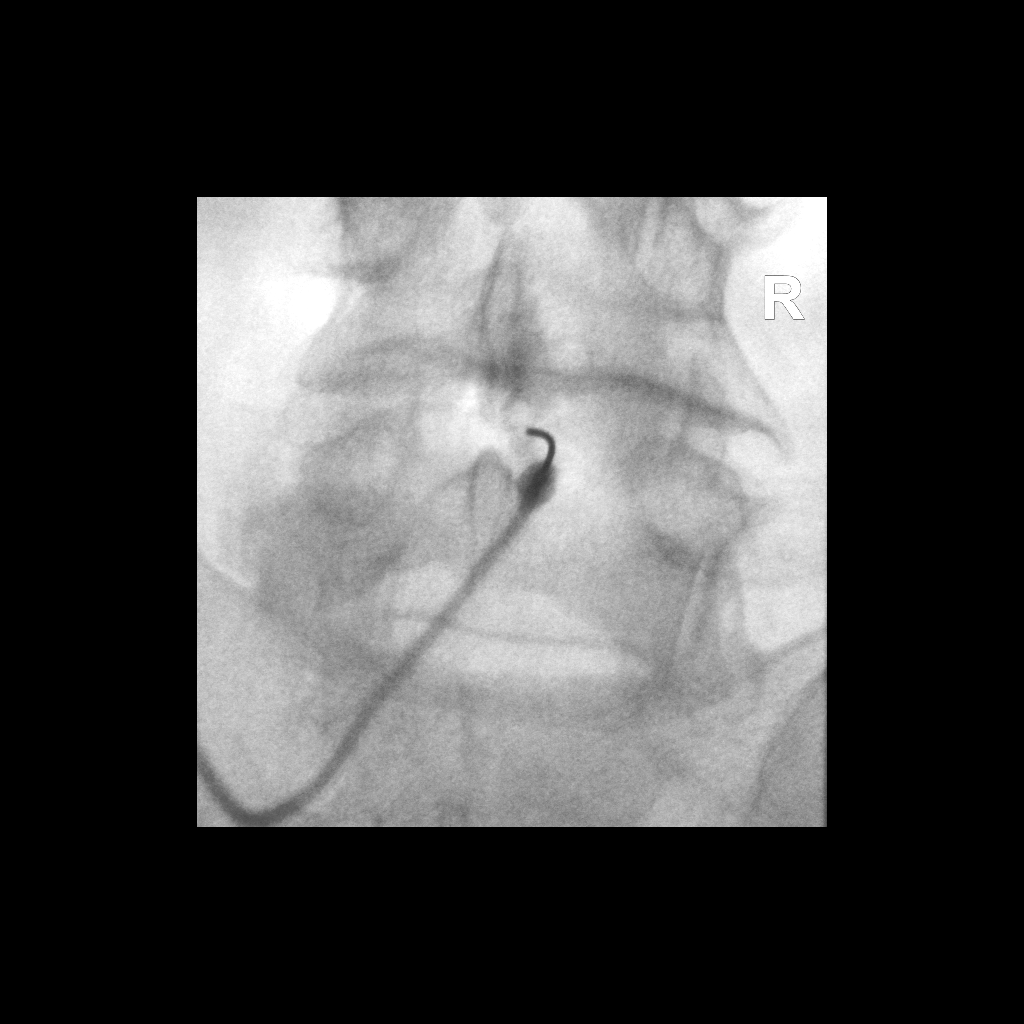

[Series 2: ortho standard · 1 of 1 slices shown (2 of 2)]
[im 1/1]
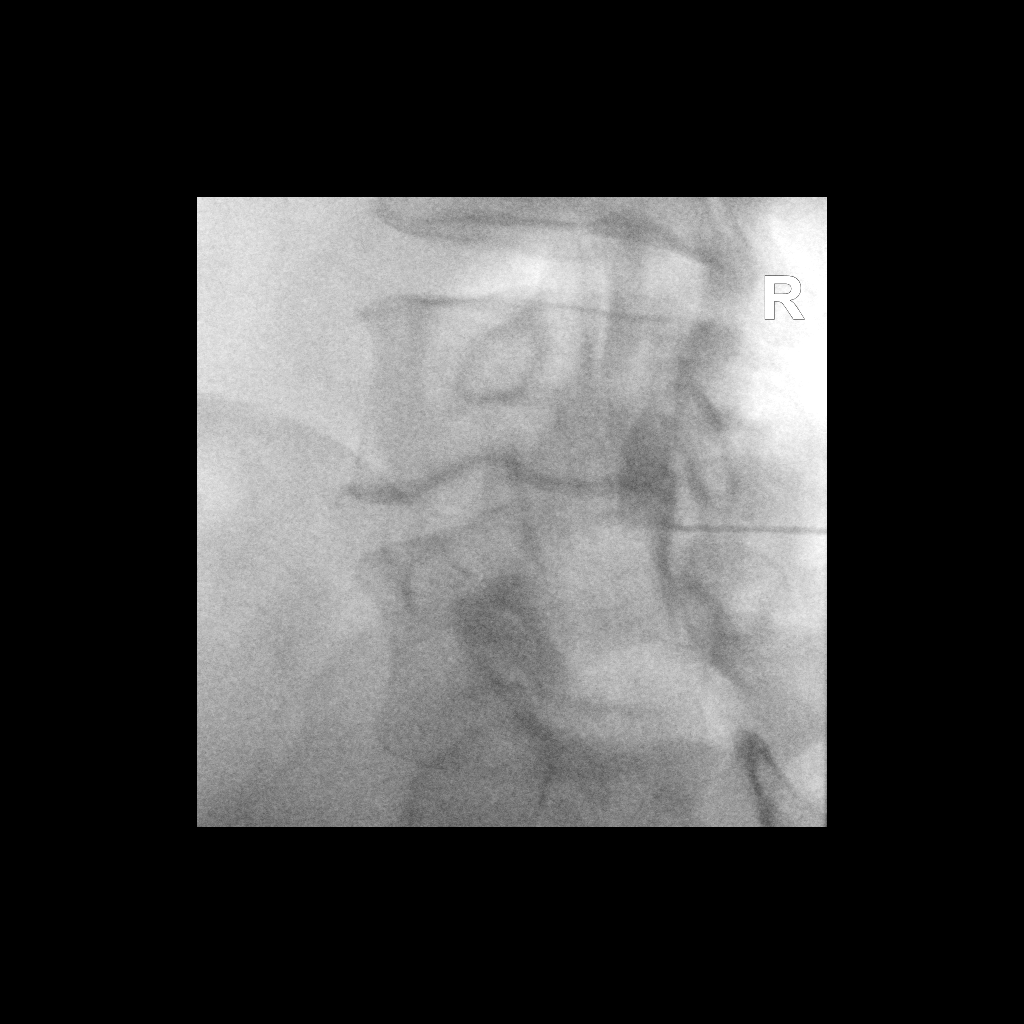

[2 of 2 positions shown; findings below may reference images not displayed]

FLUOROSCOPY TIME:  Radiation Exposure Index (as provided by the
fluoroscopic device): 18.47 uGy*m2

Fluoroscopy Time:  8 seconds

Number of Acquired Images:  0

PROCEDURE:
The procedure, risks, benefits, and alternatives were explained to
the patient. Questions regarding the procedure were encouraged and
answered. The patient understands and consents to the procedure.

LUMBAR EPIDURAL INJECTION:

An interlaminar approach was performed on right at L4-5. The
overlying skin was cleansed and anesthetized. A 20 gauge epidural
needle was advanced using loss-of-resistance technique.

DIAGNOSTIC EPIDURAL INJECTION:

Injection of Isovue-M 200 shows a good epidural pattern with spread
above and below the level of needle placement, primarily on the
right no vascular opacification is seen.

THERAPEUTIC EPIDURAL INJECTION:

120 mg of Depo-Medrol mixed with 3 mL 1% lidocaine were instilled.
The procedure was well-tolerated, and the patient was discharged
thirty minutes following the injection in good condition.

COMPLICATIONS:
None
IMPRESSION: Technically successful epidural injection on the right L4-5 # 1
